# Patient Record
Sex: Female | Born: 1970 | Race: White | Hispanic: No | Marital: Single | Smoking: Current every day smoker
Health system: Southern US, Community
[De-identification: ages and names within clinical notes are randomized; demographics above are authoritative.]

## PROBLEM LIST (undated history)

## (undated) DIAGNOSIS — N189 Chronic kidney disease, unspecified: Secondary | ICD-10-CM

## (undated) DIAGNOSIS — C539 Malignant neoplasm of cervix uteri, unspecified: Secondary | ICD-10-CM

## (undated) DIAGNOSIS — K627 Radiation proctitis: Secondary | ICD-10-CM

## (undated) HISTORY — PX: NEPHROSTOMY TUBE PLACEMENT (ARMC HX): HXRAD1726

---

## 2012-01-13 DIAGNOSIS — K627 Radiation proctitis: Secondary | ICD-10-CM

## 2012-01-13 HISTORY — PX: INSERTION BRACHYTHERAPY DEVICE: SHX6582

## 2012-01-13 HISTORY — DX: Radiation proctitis: K62.7

## 2012-05-12 HISTORY — PX: PELVIC LYMPH NODE DISSECTION: SHX6543

## 2012-07-26 DIAGNOSIS — C539 Malignant neoplasm of cervix uteri, unspecified: Secondary | ICD-10-CM | POA: Diagnosis present

## 2015-04-13 HISTORY — PX: COLECTOMY WITH COLOSTOMY CREATION/HARTMANN PROCEDURE: SHX6598

## 2015-09-13 HISTORY — PX: COLOSTOMY REVISION: SHX5232

## 2016-01-13 HISTORY — PX: COLOSTOMY REVISION: SHX5232

## 2016-06-12 HISTORY — PX: ILEO LOOP DIVERSION: SHX1780

## 2016-06-12 HISTORY — PX: COLOSTOMY TAKEDOWN: SHX5783

## 2017-11-05 HISTORY — PX: ILEO LOOP COLOSTOMY CLOSURE: SHX5257

## 2018-01-19 ENCOUNTER — Encounter (HOSPITAL_COMMUNITY): Payer: Self-pay | Admitting: Emergency Medicine

## 2018-01-19 ENCOUNTER — Inpatient Hospital Stay (HOSPITAL_COMMUNITY): Payer: PPO

## 2018-01-19 ENCOUNTER — Emergency Department (HOSPITAL_COMMUNITY): Payer: PPO

## 2018-01-19 ENCOUNTER — Inpatient Hospital Stay (HOSPITAL_COMMUNITY)
Admission: EM | Admit: 2018-01-19 | Discharge: 2018-02-12 | DRG: 981 | Disposition: E | Payer: PPO | Attending: Pulmonary Disease | Admitting: Pulmonary Disease

## 2018-01-19 DIAGNOSIS — R195 Other fecal abnormalities: Secondary | ICD-10-CM | POA: Diagnosis not present

## 2018-01-19 DIAGNOSIS — I469 Cardiac arrest, cause unspecified: Secondary | ICD-10-CM | POA: Diagnosis present

## 2018-01-19 DIAGNOSIS — Z8541 Personal history of malignant neoplasm of cervix uteri: Secondary | ICD-10-CM

## 2018-01-19 DIAGNOSIS — Z936 Other artificial openings of urinary tract status: Secondary | ICD-10-CM

## 2018-01-19 DIAGNOSIS — N135 Crossing vessel and stricture of ureter without hydronephrosis: Secondary | ICD-10-CM

## 2018-01-19 DIAGNOSIS — K257 Chronic gastric ulcer without hemorrhage or perforation: Secondary | ICD-10-CM | POA: Diagnosis present

## 2018-01-19 DIAGNOSIS — Z4659 Encounter for fitting and adjustment of other gastrointestinal appliance and device: Secondary | ICD-10-CM

## 2018-01-19 DIAGNOSIS — K922 Gastrointestinal hemorrhage, unspecified: Secondary | ICD-10-CM

## 2018-01-19 DIAGNOSIS — G9340 Encephalopathy, unspecified: Secondary | ICD-10-CM | POA: Diagnosis present

## 2018-01-19 DIAGNOSIS — K627 Radiation proctitis: Secondary | ICD-10-CM | POA: Diagnosis present

## 2018-01-19 DIAGNOSIS — E876 Hypokalemia: Secondary | ICD-10-CM | POA: Diagnosis present

## 2018-01-19 DIAGNOSIS — R0902 Hypoxemia: Secondary | ICD-10-CM | POA: Diagnosis present

## 2018-01-19 DIAGNOSIS — K921 Melena: Secondary | ICD-10-CM | POA: Diagnosis not present

## 2018-01-19 DIAGNOSIS — G8929 Other chronic pain: Secondary | ICD-10-CM | POA: Diagnosis not present

## 2018-01-19 DIAGNOSIS — N136 Pyonephrosis: Secondary | ICD-10-CM | POA: Diagnosis not present

## 2018-01-19 DIAGNOSIS — J9601 Acute respiratory failure with hypoxia: Secondary | ICD-10-CM | POA: Diagnosis not present

## 2018-01-19 DIAGNOSIS — N184 Chronic kidney disease, stage 4 (severe): Secondary | ICD-10-CM | POA: Diagnosis present

## 2018-01-19 DIAGNOSIS — G92 Toxic encephalopathy: Principal | ICD-10-CM | POA: Diagnosis present

## 2018-01-19 DIAGNOSIS — Z515 Encounter for palliative care: Secondary | ICD-10-CM | POA: Diagnosis not present

## 2018-01-19 DIAGNOSIS — G893 Neoplasm related pain (acute) (chronic): Secondary | ICD-10-CM | POA: Diagnosis present

## 2018-01-19 DIAGNOSIS — K254 Chronic or unspecified gastric ulcer with hemorrhage: Secondary | ICD-10-CM | POA: Diagnosis not present

## 2018-01-19 DIAGNOSIS — K2900 Acute gastritis without bleeding: Secondary | ICD-10-CM | POA: Diagnosis present

## 2018-01-19 DIAGNOSIS — R571 Hypovolemic shock: Secondary | ICD-10-CM | POA: Diagnosis not present

## 2018-01-19 DIAGNOSIS — R4020 Unspecified coma: Secondary | ICD-10-CM | POA: Diagnosis not present

## 2018-01-19 DIAGNOSIS — I959 Hypotension, unspecified: Secondary | ICD-10-CM | POA: Diagnosis not present

## 2018-01-19 DIAGNOSIS — Z4682 Encounter for fitting and adjustment of non-vascular catheter: Secondary | ICD-10-CM | POA: Diagnosis not present

## 2018-01-19 DIAGNOSIS — R4182 Altered mental status, unspecified: Secondary | ICD-10-CM

## 2018-01-19 DIAGNOSIS — N1330 Unspecified hydronephrosis: Secondary | ICD-10-CM | POA: Diagnosis not present

## 2018-01-19 DIAGNOSIS — B370 Candidal stomatitis: Secondary | ICD-10-CM | POA: Diagnosis present

## 2018-01-19 DIAGNOSIS — E872 Acidosis: Secondary | ICD-10-CM | POA: Diagnosis not present

## 2018-01-19 DIAGNOSIS — I776 Arteritis, unspecified: Secondary | ICD-10-CM | POA: Diagnosis present

## 2018-01-19 DIAGNOSIS — K5900 Constipation, unspecified: Secondary | ICD-10-CM | POA: Diagnosis not present

## 2018-01-19 DIAGNOSIS — D696 Thrombocytopenia, unspecified: Secondary | ICD-10-CM | POA: Diagnosis present

## 2018-01-19 DIAGNOSIS — Z978 Presence of other specified devices: Secondary | ICD-10-CM

## 2018-01-19 DIAGNOSIS — I7772 Dissection of iliac artery: Secondary | ICD-10-CM | POA: Diagnosis not present

## 2018-01-19 DIAGNOSIS — I248 Other forms of acute ischemic heart disease: Secondary | ICD-10-CM | POA: Diagnosis not present

## 2018-01-19 DIAGNOSIS — Z436 Encounter for attention to other artificial openings of urinary tract: Secondary | ICD-10-CM | POA: Diagnosis not present

## 2018-01-19 DIAGNOSIS — R578 Other shock: Secondary | ICD-10-CM | POA: Diagnosis not present

## 2018-01-19 DIAGNOSIS — E860 Dehydration: Secondary | ICD-10-CM | POA: Diagnosis present

## 2018-01-19 DIAGNOSIS — D649 Anemia, unspecified: Secondary | ICD-10-CM

## 2018-01-19 DIAGNOSIS — F17200 Nicotine dependence, unspecified, uncomplicated: Secondary | ICD-10-CM | POA: Diagnosis present

## 2018-01-19 DIAGNOSIS — C539 Malignant neoplasm of cervix uteri, unspecified: Secondary | ICD-10-CM | POA: Diagnosis present

## 2018-01-19 DIAGNOSIS — D62 Acute posthemorrhagic anemia: Secondary | ICD-10-CM | POA: Diagnosis present

## 2018-01-19 DIAGNOSIS — R922 Inconclusive mammogram: Secondary | ICD-10-CM | POA: Diagnosis not present

## 2018-01-19 DIAGNOSIS — Z452 Encounter for adjustment and management of vascular access device: Secondary | ICD-10-CM

## 2018-01-19 DIAGNOSIS — I772 Rupture of artery: Secondary | ICD-10-CM | POA: Diagnosis not present

## 2018-01-19 DIAGNOSIS — N179 Acute kidney failure, unspecified: Secondary | ICD-10-CM | POA: Diagnosis present

## 2018-01-19 DIAGNOSIS — E878 Other disorders of electrolyte and fluid balance, not elsewhere classified: Secondary | ICD-10-CM | POA: Diagnosis present

## 2018-01-19 DIAGNOSIS — K91840 Postprocedural hemorrhage and hematoma of a digestive system organ or structure following a digestive system procedure: Secondary | ICD-10-CM | POA: Diagnosis not present

## 2018-01-19 DIAGNOSIS — T402X5A Adverse effect of other opioids, initial encounter: Secondary | ICD-10-CM | POA: Diagnosis present

## 2018-01-19 DIAGNOSIS — K633 Ulcer of intestine: Secondary | ICD-10-CM | POA: Diagnosis present

## 2018-01-19 DIAGNOSIS — K920 Hematemesis: Secondary | ICD-10-CM | POA: Diagnosis not present

## 2018-01-19 DIAGNOSIS — Z923 Personal history of irradiation: Secondary | ICD-10-CM

## 2018-01-19 DIAGNOSIS — M549 Dorsalgia, unspecified: Secondary | ICD-10-CM | POA: Diagnosis present

## 2018-01-19 DIAGNOSIS — J9811 Atelectasis: Secondary | ICD-10-CM | POA: Diagnosis not present

## 2018-01-19 DIAGNOSIS — Z91041 Radiographic dye allergy status: Secondary | ICD-10-CM

## 2018-01-19 DIAGNOSIS — D509 Iron deficiency anemia, unspecified: Secondary | ICD-10-CM | POA: Diagnosis not present

## 2018-01-19 DIAGNOSIS — T83098A Other mechanical complication of other indwelling urethral catheter, initial encounter: Secondary | ICD-10-CM

## 2018-01-19 DIAGNOSIS — Z9221 Personal history of antineoplastic chemotherapy: Secondary | ICD-10-CM

## 2018-01-19 HISTORY — DX: Malignant neoplasm of cervix uteri, unspecified: C53.9

## 2018-01-19 HISTORY — DX: Chronic kidney disease, unspecified: N18.9

## 2018-01-19 HISTORY — DX: Radiation proctitis: K62.7

## 2018-01-19 LAB — COMPREHENSIVE METABOLIC PANEL
ALT: 9 U/L (ref 0–44)
AST: 20 U/L (ref 15–41)
Albumin: 2.5 g/dL — ABNORMAL LOW (ref 3.5–5.0)
Alkaline Phosphatase: 67 U/L (ref 38–126)
Anion gap: 14 (ref 5–15)
BUN: 69 mg/dL — AB (ref 6–20)
CO2: 16 mmol/L — ABNORMAL LOW (ref 22–32)
Calcium: 9.2 mg/dL (ref 8.9–10.3)
Chloride: 104 mmol/L (ref 98–111)
Creatinine, Ser: 5.51 mg/dL — ABNORMAL HIGH (ref 0.44–1.00)
GFR calc Af Amer: 10 mL/min — ABNORMAL LOW (ref >60)
GFR calc non Af Amer: 9 mL/min — ABNORMAL LOW (ref >60)
Glucose, Bld: 98 mg/dL (ref 70–99)
Potassium: 4.5 mmol/L (ref 3.5–5.1)
Sodium: 134 mmol/L — ABNORMAL LOW (ref 135–145)
Total Bilirubin: 0.6 mg/dL (ref 0.3–1.2)
Total Protein: 7.5 g/dL (ref 6.5–8.1)

## 2018-01-19 LAB — CBC WITH DIFFERENTIAL/PLATELET
Abs Immature Granulocytes: 0.15 10*3/uL — ABNORMAL HIGH (ref 0.00–0.07)
Basophils Absolute: 0.1 10*3/uL (ref 0.0–0.1)
Basophils Relative: 1 %
Eosinophils Absolute: 0.2 10*3/uL (ref 0.0–0.5)
Eosinophils Relative: 2 %
HCT: 22.1 % — ABNORMAL LOW (ref 36.0–46.0)
Hemoglobin: 6.3 g/dL — CL (ref 12.0–15.0)
Immature Granulocytes: 2 %
LYMPHS PCT: 14 %
Lymphs Abs: 1.4 10*3/uL (ref 0.7–4.0)
MCH: 28.1 pg (ref 26.0–34.0)
MCHC: 28.5 g/dL — ABNORMAL LOW (ref 30.0–36.0)
MCV: 98.7 fL (ref 80.0–100.0)
MONO ABS: 0.9 10*3/uL (ref 0.1–1.0)
Monocytes Relative: 9 %
Neutro Abs: 7.2 10*3/uL (ref 1.7–7.7)
Neutrophils Relative %: 72 %
PLATELETS: 847 10*3/uL — AB (ref 150–400)
RBC: 2.24 MIL/uL — ABNORMAL LOW (ref 3.87–5.11)
RDW: 15.9 % — ABNORMAL HIGH (ref 11.5–15.5)
WBC: 9.9 10*3/uL (ref 4.0–10.5)
nRBC: 0 % (ref 0.0–0.2)

## 2018-01-19 LAB — I-STAT BETA HCG BLOOD, ED (MC, WL, AP ONLY): I-stat hCG, quantitative: 8.4 m[IU]/mL — ABNORMAL HIGH (ref <5)

## 2018-01-19 LAB — SALICYLATE LEVEL: Salicylate Lvl: <7 mg/dL (ref 2.8–30.0)

## 2018-01-19 LAB — AMMONIA: Ammonia: 15 umol/L (ref 9–35)

## 2018-01-19 LAB — ABO/RH: ABO/RH(D): A POS

## 2018-01-19 LAB — PREPARE RBC (CROSSMATCH)

## 2018-01-19 LAB — I-STAT CG4 LACTIC ACID, ED
Lactic Acid, Venous: 0.45 mmol/L — ABNORMAL LOW (ref 0.5–1.9)
Lactic Acid, Venous: 0.75 mmol/L (ref 0.5–1.9)

## 2018-01-19 LAB — ETHANOL: Alcohol, Ethyl (B): <10 mg/dL (ref <10)

## 2018-01-19 LAB — ACETAMINOPHEN LEVEL: Acetaminophen (Tylenol), Serum: <10 ug/mL — ABNORMAL LOW (ref 10–30)

## 2018-01-19 MED ORDER — SODIUM CHLORIDE 0.9 % IV SOLN
10.0000 mL/h | Freq: Once | INTRAVENOUS | Status: AC
  Administered 2018-01-19: 10 mL/h via INTRAVENOUS

## 2018-01-19 MED ORDER — SODIUM CHLORIDE 0.9 % IV BOLUS
1000.0000 mL | Freq: Once | INTRAVENOUS | Status: AC
  Administered 2018-01-20: 1000 mL via INTRAVENOUS

## 2018-01-19 MED ORDER — SODIUM CHLORIDE 0.9 % IV BOLUS (SEPSIS)
1000.0000 mL | Freq: Once | INTRAVENOUS | Status: AC
  Administered 2018-01-19: 1000 mL via INTRAVENOUS

## 2018-01-19 NOTE — ED Notes (Signed)
Pt found to have ripped out 2nd IV. RN to call IV team to look for a second IV.

## 2018-01-19 NOTE — ED Notes (Signed)
IV team at bedside 

## 2018-01-19 NOTE — ED Triage Notes (Signed)
Pt presents to ED for AMS. Pt has hx of cervical cancer, bilateral nephrostomy tubes placed for about 18 months. Pt very pale, lethargic, hypotensive 93/64 in triage.

## 2018-01-19 NOTE — ED Notes (Signed)
Patient transported to X-ray 

## 2018-01-19 NOTE — ED Notes (Signed)
Blood consent signed by sister and witnessed by this RN.

## 2018-01-19 NOTE — ED Provider Notes (Signed)
[facility] Provider Note   CSN: [id number] Arrival date & time: 02/10/2018  1843     History   Chief Complaint Chief Complaint  Patient presents with  . Altered Mental Status    HPI Katherine Kramer is a 48 y.o. female.  HPI Family and friends brought her to the ED because pt had not been answering the phone the last two days.  When they found her, her urostomy tube was unconnected.  The other tube looked dirty.  Pt appeared very disheveled and confused.   Patient has a complicated medical history.  She has a history of cervical cancer with complications.  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  Patient previously had a colostomy but that was reversed .  pt has chronic back pain issues with her cancer.  They care concerned that she had take too many pills.  Pt had 120 pills of oxycodone pills dec 18 and she only 5 pills left.  She also had a fentanyl patch on.   Pt denies fever.  She has been having diarrhea.   Past Medical History:  Diagnosis Date  . Cervical cancer ([place])   . CKD (chronic kidney disease)     There are no active problems to display for this patient.   History reviewed. No pertinent surgical history.     Home Medications    Prior to Admission medications   Not on File    Family History No family history on file.  Social History Social History   Tobacco Use  . Smoking status: Not on file  Substance Use Topics  . Alcohol use: Not on file  . Drug use: Not on file     Allergies   Patient has no allergy information on record.   Review of Systems Review of Systems  All other systems reviewed and are negative.    Physical Exam Updated Vital Signs BP (!) 88/61   Pulse 79   Temp (!) 97.5 F (36.4 C) (Oral)   Resp 12   SpO2 100%   Physical Exam Vitals signs and nursing note reviewed.  Constitutional:      General: She is not in acute distress.    Appearance: She  is well-developed. She is ill-appearing.     Comments: Underweight  HENT:     Head: Normocephalic and atraumatic.     Right Ear: External ear normal.     Left Ear: External ear normal.  Eyes:     General: No scleral icterus.       Right eye: No discharge.        Left eye: No discharge.     Conjunctiva/sclera: Conjunctivae normal.  Neck:     Musculoskeletal: Neck supple.     Trachea: No tracheal deviation.  Cardiovascular:     Rate and Rhythm: Normal rate and regular rhythm.  Pulmonary:     Effort: Pulmonary effort is normal. No respiratory distress.     Breath sounds: Normal breath sounds. No stridor. No wheezing or rales.  Abdominal:     General: Bowel sounds are normal. There is no distension.     Palpations: Abdomen is soft.     Tenderness: There is no abdominal tenderness. There is no guarding or rebound.     Comments: Nephrostomy tubes in the flank area, no purulent drainage no erythema  Musculoskeletal:        General: No tenderness.  Skin:    General: Skin is warm  and dry.     Findings: No rash.  Neurological:     Mental Status: She is lethargic.     GCS: GCS eye subscore is 4. GCS verbal subscore is 4. GCS motor subscore is 6.     Cranial Nerves: No cranial nerve deficit (no facial droop, extraocular movements intact, no slurred speech).     Sensory: No sensory deficit.     Motor: No abnormal muscle tone or seizure activity.     Coordination: Coordination normal.     Comments: No focal neurologic deficits, patient is confused and per the family answering questions inappropriately      ED Treatments / Results  Labs (all labs ordered are listed, but only abnormal results are displayed) Labs Reviewed  COMPREHENSIVE METABOLIC PANEL - Abnormal; Notable for the following components:      Result Value   Sodium 134 (*)    CO2 16 (*)    BUN 69 (*)    Creatinine, Ser 5.51 (*)    Albumin 2.5 (*)    GFR calc non Af Amer 9 (*)    GFR calc Af Amer 10 (*)    All other  components within normal limits  CBC WITH DIFFERENTIAL/PLATELET - Abnormal; Notable for the following components:   RBC 2.24 (*)    Hemoglobin 6.3 (*)    HCT 22.1 (*)    MCHC 28.5 (*)    RDW 15.9 (*)    Platelets 847 (*)    Abs Immature Granulocytes 0.15 (*)    All other components within normal limits  ACETAMINOPHEN LEVEL - Abnormal; Notable for the following components:   Acetaminophen (Tylenol), Serum <10 (*)    All other components within normal limits  I-STAT CG4 LACTIC ACID, ED - Abnormal; Notable for the following components:   Lactic Acid, Venous 0.45 (*)    All other components within normal limits  I-STAT BETA HCG BLOOD, ED ([facility], [facility], [facility] ONLY) - Abnormal; Notable for the following components:   I-stat hCG, quantitative 8.4 (*)    All other components within normal limits  CULTURE, BLOOD (ROUTINE X 2)  CULTURE, BLOOD (ROUTINE X 2)  ETHANOL  SALICYLATE LEVEL  AMMONIA  URINALYSIS, ROUTINE W REFLEX MICROSCOPIC  RAPID URINE DRUG SCREEN, HOSP PERFORMED  I-STAT CG4 LACTIC ACID, ED  POC OCCULT BLOOD, ED  PREPARE RBC (CROSSMATCH)  TYPE AND SCREEN    EKG None  Radiology Ct Head Wo Contrast  Result Date: 02/09/2018 CLINICAL DATA:  Altered level of consciousness (LOC), unexplained EXAM: CT HEAD WITHOUT CONTRAST TECHNIQUE: Contiguous axial images were obtained from the base of the skull through the vertex without intravenous contrast. COMPARISON:  None. FINDINGS: Brain: No intracranial hemorrhage, mass effect, or midline shift. No hydrocephalus. The basilar cisterns are patent. No evidence of territorial infarct or acute ischemia. No extra-axial or intracranial fluid collection. Vascular: No hyperdense vessel. Skull: No fracture or focal lesion. Sinuses/Orbits: Paranasal sinuses and mastoid air cells are clear. The visualized orbits are unremarkable. Other: None. IMPRESSION: Negative head CT. Electronically Signed   By: Keith Rake M.D.   On: 01/13/2018 20:41   Dg Abdomen Acute  W/chest  Result Date: 02/09/2018 CLINICAL DATA:  Hypotension with nephrostomy tubes and history of ovarian cancer. EXAM: DG ABDOMEN ACUTE W/ 1V CHEST COMPARISON:  None. FINDINGS: There is no evidence of dilated bowel loops or free intraperitoneal air. Moderate stool retention within the colon. Bilateral pigtail ureteral calculi are noted as well as bilateral external nephrostomy tubes without complicating  features. No radiopaque calculi or other significant radiographic abnormality is seen. Heart size and mediastinal contours are within normal limits. Both lungs are clear. IMPRESSION: Moderate stool retention within the colon without bowel obstruction. Bilateral ureteral and nephrostomy tubes are in place without complicating features. No active pulmonary disease. Electronically Signed   By: Ashley Royalty M.D.   On: 01/25/2018 21:55    Procedures .Critical Care Performed by: Dorie Rank, MD Authorized by: Dorie Rank, MD   Critical care provider statement:    Critical care time (minutes):  45   Critical care was time spent personally by me on the following activities:  Discussions with consultants, evaluation of patient's response to treatment, examination of patient, ordering and performing treatments and interventions, ordering and review of laboratory studies, ordering and review of radiographic studies, pulse oximetry, re-evaluation of patient's condition, obtaining history from patient or surrogate and review of old charts   (including critical care time)  Medications Ordered in ED Medications  0.9 %  sodium chloride infusion (has no administration in time range)  sodium chloride 0.9 % bolus 1,000 mL (has no administration in time range)  sodium chloride 0.9 % bolus 1,000 mL (1,000 mLs Intravenous New Bag/Given 02/11/2018 2006)    And  sodium chloride 0.9 % bolus 1,000 mL (1,000 mLs Intravenous New Bag/Given 02/08/2018 2050)     Initial Impression / Assessment and Plan / ED Course  I have reviewed  the triage vital signs and the nursing notes.  Pertinent labs & imaging results that were available during my care of the patient were reviewed by me and considered in my medical decision making (see chart for details).  Clinical Course as of Jan 20 2219  Wed Jan 19, 2018  2045 Hemoglobin was 8.1 on November 13 per care everywhere records   [JK]  2045 Anemia noted.  With her hypotension I will order blood transfusion   [JK]  2128 CR 3.19 in Nov   [JK]    Clinical Course User Index [JK] Dorie Rank, MD    Patient presented to the emergency room for evaluation of altered mental status.  Family members were concerned that she was taking too many of her medications.  Patient also has multiple medical problems.  Her ED work-up is notable for anemia that is worse compared to her baseline.  She also has evidence of acute chronic kidney injury with increasing creatinine compared to her baseline.  Patient has been hypotensive but she does not have an elevated lactic acid level and I suspect her hypotension is related to her dehydration.  Unclear if the patient's had any blood in her stool so we will Hemoccult her her stool.  I have ordered a blood transfusion for her anemia.  I will consult the medical service for admission and further treatment.  Final Clinical Impressions(s) / ED Diagnoses   Final diagnoses:  AKI (acute kidney injury) ([place])  Anemia, unspecified type  Altered mental status, unspecified altered mental status type     Dorie Rank, MD 02/08/2018 2226

## 2018-01-19 NOTE — ED Notes (Signed)
ED Provider at bedside. 

## 2018-01-20 ENCOUNTER — Encounter (HOSPITAL_COMMUNITY): Payer: Self-pay | Admitting: Internal Medicine

## 2018-01-20 ENCOUNTER — Inpatient Hospital Stay (HOSPITAL_COMMUNITY): Payer: PPO

## 2018-01-20 DIAGNOSIS — D649 Anemia, unspecified: Secondary | ICD-10-CM

## 2018-01-20 DIAGNOSIS — N184 Chronic kidney disease, stage 4 (severe): Secondary | ICD-10-CM | POA: Diagnosis present

## 2018-01-20 DIAGNOSIS — G9340 Encephalopathy, unspecified: Secondary | ICD-10-CM | POA: Diagnosis present

## 2018-01-20 LAB — URINALYSIS, ROUTINE W REFLEX MICROSCOPIC
Bilirubin Urine: NEGATIVE
Glucose, UA: NEGATIVE mg/dL
Ketones, ur: NEGATIVE mg/dL
Nitrite: NEGATIVE
Protein, ur: 100 mg/dL — AB
RBC / HPF: >50 RBC/hpf — ABNORMAL HIGH (ref 0–5)
Specific Gravity, Urine: 1.008 (ref 1.005–1.030)
pH: 8 (ref 5.0–8.0)

## 2018-01-20 LAB — CBC WITH DIFFERENTIAL/PLATELET
Abs Immature Granulocytes: 0.16 10*3/uL — ABNORMAL HIGH (ref 0.00–0.07)
Abs Immature Granulocytes: 0.33 10*3/uL — ABNORMAL HIGH (ref 0.00–0.07)
Basophils Absolute: 0 10*3/uL (ref 0.0–0.1)
Basophils Absolute: 0.1 10*3/uL (ref 0.0–0.1)
Basophils Relative: 1 %
Basophils Relative: 1 %
EOS PCT: 2 %
Eosinophils Absolute: 0.2 10*3/uL (ref 0.0–0.5)
Eosinophils Absolute: 0.2 10*3/uL (ref 0.0–0.5)
Eosinophils Relative: 3 %
HCT: 22.1 % — ABNORMAL LOW (ref 36.0–46.0)
HCT: 33 % — ABNORMAL LOW (ref 36.0–46.0)
HEMOGLOBIN: 9.9 g/dL — AB (ref 12.0–15.0)
Hemoglobin: 6.3 g/dL — CL (ref 12.0–15.0)
Immature Granulocytes: 2 %
Immature Granulocytes: 4 %
LYMPHS PCT: 13 %
Lymphocytes Relative: 12 %
Lymphs Abs: 1 10*3/uL (ref 0.7–4.0)
Lymphs Abs: 1.1 10*3/uL (ref 0.7–4.0)
MCH: 27.9 pg (ref 26.0–34.0)
MCH: 28.8 pg (ref 26.0–34.0)
MCHC: 28.5 g/dL — ABNORMAL LOW (ref 30.0–36.0)
MCHC: 30 g/dL (ref 30.0–36.0)
MCV: 97.8 fL (ref 80.0–100.0)
MCV: 95.9 fL (ref 80.0–100.0)
MONO ABS: 0.7 10*3/uL (ref 0.1–1.0)
Monocytes Absolute: 0.8 10*3/uL (ref 0.1–1.0)
Monocytes Relative: 10 %
Monocytes Relative: 8 %
NEUTROS ABS: 5.6 10*3/uL (ref 1.7–7.7)
Neutro Abs: 6.7 10*3/uL (ref 1.7–7.7)
Neutrophils Relative %: 71 %
Neutrophils Relative %: 73 %
Platelets: 623 10*3/uL — ABNORMAL HIGH (ref 150–400)
Platelets: 774 10*3/uL — ABNORMAL HIGH (ref 150–400)
RBC: 2.26 MIL/uL — AB (ref 3.87–5.11)
RBC: 3.44 MIL/uL — ABNORMAL LOW (ref 3.87–5.11)
RDW: 15.5 % (ref 11.5–15.5)
RDW: 15.3 % (ref 11.5–15.5)
WBC: 7.9 10*3/uL (ref 4.0–10.5)
WBC: 9 10*3/uL (ref 4.0–10.5)
nRBC: 0 % (ref 0.0–0.2)
nRBC: 0 % (ref 0.0–0.2)

## 2018-01-20 LAB — COMPREHENSIVE METABOLIC PANEL
ALT: 6 U/L (ref 0–44)
AST: 16 U/L (ref 15–41)
Albumin: 1.9 g/dL — ABNORMAL LOW (ref 3.5–5.0)
Alkaline Phosphatase: 51 U/L (ref 38–126)
Anion gap: 11 (ref 5–15)
BILIRUBIN TOTAL: 0.9 mg/dL (ref 0.3–1.2)
BUN: 62 mg/dL — ABNORMAL HIGH (ref 6–20)
CO2: 14 mmol/L — ABNORMAL LOW (ref 22–32)
Calcium: 7.9 mg/dL — ABNORMAL LOW (ref 8.9–10.3)
Chloride: 113 mmol/L — ABNORMAL HIGH (ref 98–111)
Creatinine, Ser: 4.78 mg/dL — ABNORMAL HIGH (ref 0.44–1.00)
GFR calc Af Amer: 12 mL/min — ABNORMAL LOW (ref >60)
GFR calc non Af Amer: 10 mL/min — ABNORMAL LOW (ref >60)
Glucose, Bld: 89 mg/dL (ref 70–99)
Potassium: 4 mmol/L (ref 3.5–5.1)
Sodium: 138 mmol/L (ref 135–145)
TOTAL PROTEIN: 5.8 g/dL — AB (ref 6.5–8.1)

## 2018-01-20 LAB — RAPID URINE DRUG SCREEN, HOSP PERFORMED
Amphetamines: NOT DETECTED
BARBITURATES: NOT DETECTED
Benzodiazepines: POSITIVE — AB
Cocaine: NOT DETECTED
Opiates: POSITIVE — AB
Tetrahydrocannabinol: NOT DETECTED

## 2018-01-20 LAB — PROCALCITONIN: Procalcitonin: 1.25 ng/mL

## 2018-01-20 LAB — I-STAT ARTERIAL BLOOD GAS, ED
ACID-BASE DEFICIT: 11 mmol/L — AB (ref 0.0–2.0)
Bicarbonate: 14.7 mmol/L — ABNORMAL LOW (ref 20.0–28.0)
O2 Saturation: 95 %
Patient temperature: 97.8
TCO2: 16 mmol/L — ABNORMAL LOW (ref 22–32)
pCO2 arterial: 31.6 mmHg — ABNORMAL LOW (ref 32.0–48.0)
pH, Arterial: 7.272 — ABNORMAL LOW (ref 7.350–7.450)
pO2, Arterial: 85 mmHg (ref 83.0–108.0)

## 2018-01-20 LAB — CK: Total CK: 118 U/L (ref 38–234)

## 2018-01-20 LAB — HCG, SERUM, QUALITATIVE: Preg, Serum: NEGATIVE

## 2018-01-20 LAB — GLUCOSE, CAPILLARY
Glucose-Capillary: 60 mg/dL — ABNORMAL LOW (ref 70–99)
Glucose-Capillary: 66 mg/dL — ABNORMAL LOW (ref 70–99)
Glucose-Capillary: 72 mg/dL (ref 70–99)

## 2018-01-20 LAB — TROPONIN I: Troponin I: <0.03 ng/mL (ref <0.03)

## 2018-01-20 LAB — HIV ANTIBODY (ROUTINE TESTING W REFLEX): HIV Screen 4th Generation wRfx: NONREACTIVE

## 2018-01-20 LAB — POC OCCULT BLOOD, ED: Fecal Occult Bld: POSITIVE — AB

## 2018-01-20 LAB — LACTIC ACID, PLASMA: Lactic Acid, Venous: 0.4 mmol/L — ABNORMAL LOW (ref 0.5–1.9)

## 2018-01-20 LAB — CBG MONITORING, ED: Glucose-Capillary: 79 mg/dL (ref 70–99)

## 2018-01-20 MED ORDER — ACETAMINOPHEN 325 MG PO TABS: 650.0000 mg | ORAL_TABLET | Freq: Four times a day (QID) | ORAL | Status: DC | PRN

## 2018-01-20 MED ORDER — ONDANSETRON HCL 4 MG PO TABS: 4.0000 mg | ORAL_TABLET | Freq: Four times a day (QID) | ORAL | Status: DC | PRN

## 2018-01-20 MED ORDER — CIPROFLOXACIN IN D5W 200 MG/100ML IV SOLN
200.0000 mg | INTRAVENOUS | Status: DC
  Administered 2018-01-20: 200 mg via INTRAVENOUS
  Filled 2018-01-20: qty 100

## 2018-01-20 MED ORDER — BISACODYL 10 MG RE SUPP: 10.0000 mg | Freq: Once | RECTAL | Status: DC

## 2018-01-20 MED ORDER — SODIUM CHLORIDE 0.9 % IV SOLN
INTRAVENOUS | Status: DC
  Administered 2018-01-20 – 2018-01-21 (×3): via INTRAVENOUS

## 2018-01-20 MED ORDER — SODIUM CHLORIDE 0.9 % IV SOLN
INTRAVENOUS | Status: DC
  Administered 2018-01-20: 03:00:00 via INTRAVENOUS

## 2018-01-20 MED ORDER — ACETAMINOPHEN 650 MG RE SUPP: 650.0000 mg | Freq: Four times a day (QID) | RECTAL | Status: DC | PRN

## 2018-01-20 MED ORDER — ONDANSETRON HCL 4 MG/2ML IJ SOLN
4.0000 mg | Freq: Four times a day (QID) | INTRAMUSCULAR | Status: DC | PRN
  Administered 2018-01-20 – 2018-01-23 (×2): 4 mg via INTRAVENOUS
  Filled 2018-01-20 (×2): qty 2

## 2018-01-20 NOTE — H&P (Signed)
History and Physical    Katherine Kramer BJS:[id number] DOB: 04-Apr-1970 DOA: 02/03/2018  PCP: Patient, No Pcp Per  Patient coming from: Home.  History of new from patient's sister.  Patient is adopted.  Chief Complaint: Altered mental status.  HPI: Katherine Kramer is a 48 y.o. 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  As per the patient's sister patient was unable to be reached for last 2 days and went to check on the patient and patient was lying on the bed confused and lethargic.  Patient does not drink alcohol has had previous history of tobacco abuse which patient has quit.  As per the patient's sister patient primary care physician is planning to decrease her oxycodone intake and has been prescribed 120 pills of oxycodone on December 29, 2017 and only 5 pills are remaining when they checked the pillbox today.  It was suspected that patient may have taken more than her required dose of oxycodone.   ED Course: In the ER patient remained hypotensive but lactate was normal patient was afebrile and labs show worsening renal function with creatinine around 5 baseline is around 3.1 hemoglobin has decreased to 6 baseline is around 8.  Blood pressure improved with 3 days of fluids.  Blood cultures were sent.  UA was still pending at the time of my exam.  Nephrostomy tube is making minimal output.  Ultrasound of the abdomen was done which shows bilateral hydronephrosis recent CT abdomen done on yesterday also showed similar picture.  I did discuss with on-call urologist.  Patient at the time of my exam is answering few questions but at times becomes confused.  Moving all extremities.  CT head  was unremarkable.  Urine drug screen is positive for opiates and benzos which patient does take.  Pupils not pinpoint.  Patient admitted for acute encephalopathy likely from medication overdose in the setting of worsening renal function.  Acute abdominal series shows moderate stool with the nephrostomy tube in place.  Chest was unremarkable.  Review of Systems: As per HPI, rest all negative.   Past Medical History:  Diagnosis Date  . Cervical cancer ([place])   . CKD (chronic kidney disease)     Past Surgical History:  Procedure Laterality Date  . bilatteral salpingo oopherectomy    . COLON SURGERY    . NEPHROSTOMY       reports that she has been smoking. She has never used smokeless tobacco. No history on file for alcohol and drug.  Allergies  Allergen Reactions  . Iodine Other (See Comments)    headache  . Iodinated Diagnostic Agents Other (See Comments)    Pt states the allergy was to IV dye not oral contrast  . Piperacillin-Tazobactam In Dex Other (See Comments)    headache    Family History  Family history unknown: Yes    Prior to Admission medications   Medication Sig Start Date End Date Taking? Authorizing Provider  acetaminophen (TYLENOL) 500 MG tablet Take 1,000 mg by mouth every 8 (eight) hours as needed for pain. 11/10/17  Yes [provider]  ALPRAZolam Duanne Moron) 1 MG tablet Take 1 mg by mouth at bedtime as needed for anxiety.   Yes [provider]  alprazolam Duanne Moron) 2 MG tablet Take  2 mg by mouth 2 (two) times daily as needed for anxiety. 03/24/17  Yes [provider]  fentaNYL (DURAGESIC - DOSED MCG/HR) 75 MCG/HR Place 1 patch onto the skin every 3 (three) days. 03/22/17  Yes [provider]  gabapentin (NEURONTIN) 300 MG capsule Take 300 mg by mouth 3 (three) times daily. 11/10/17  Yes [provider]  ondansetron (ZOFRAN) 8 MG tablet Take 8 mg by mouth as needed for nausea/vomiting.   Yes [provider]    oxyCODONE (ROXICODONE) 15 MG immediate release tablet Take 15 mg by mouth every 4 (four) hours as needed for pain.   Yes [provider]  phenazopyridine (PYRIDIUM) 95 MG tablet Take 95 mg by mouth 3 (three) times daily as needed for pain.   Yes [provider]  senna (SENNA-TIME) 8.6 MG tablet Take 1 tablet by mouth 2 (two) times daily.   Yes [provider]  sodium bicarbonate 650 MG tablet Take 650 mg by mouth 2 (two) times daily. 03/25/17 03/25/18 Yes [provider]  Vitamin D, Ergocalciferol, (DRISDOL) 1.25 MG (50000 UT) CAPS capsule Take 50,000 Units by mouth once a week. 03/25/17 03/25/18 Yes [provider]    Physical Exam: Vitals:   02/05/2018 2300 02/06/2018 2330 01/25/2018 2333 01/18/2018 2334  BP: (!) 85/55 99/69 105/76 105/76  Pulse: 80 80 (!) 120 69  Resp: (!) 23 16 18 17   Temp:    (!) 97.4 F (36.3 C)  TempSrc:    Oral  SpO2: 100% 100% 100% 100%      Constitutional: Moderately built and nourished. Vitals:   01/13/2018 2300 01/21/2018 2330 01/21/2018 2333 02/03/2018 2334  BP: (!) 85/55 99/69 105/76 105/76  Pulse: 80 80 (!) 120 69  Resp: (!) 23 16 18 17   Temp:    (!) 97.4 F (36.3 C)  TempSrc:    Oral  SpO2: 100% 100% 100% 100%   Eyes: Anicteric no pallor. ENMT: No discharge from the ears eyes nose or mouth. Neck: No mass felt.  No neck rigidity. Respiratory: No rhonchi or crepitations. Cardiovascular: S1-S2 heard. Abdomen: Soft nontender bowel sounds present. Musculoskeletal: No edema.  No joint effusion. Skin: No rash. Neurologic: Patient is alert awake oriented to her name and place appears confused moves all extremities. Psychiatric: Appears confused oriented to name and place.   Labs on Admission: I have personally reviewed following labs and imaging studies  CBC: Recent Labs  Lab 01/14/2018 2000  WBC 9.9  NEUTROABS 7.2  HGB 6.3*  HCT 22.1*  MCV 98.7  PLT 518*   Basic Metabolic Panel: Recent Labs  Lab  02/07/2018 2000  NA 134*  K 4.5  CL 104  CO2 16*  GLUCOSE 98  BUN 69*  CREATININE 5.51*  CALCIUM 9.2   GFR: CrCl cannot be calculated (Unknown ideal weight.). Liver Function Tests: Recent Labs  Lab 02/09/2018 2000  AST 20  ALT 9  ALKPHOS 67  BILITOT 0.6  PROT 7.5  ALBUMIN 2.5*   No results for input(s): LIPASE, AMYLASE in the last 168 hours. Recent Labs  Lab 01/26/2018 2000  AMMONIA 15   Coagulation Profile: No results for input(s): INR, PROTIME in the last 168 hours. Cardiac Enzymes: No results for input(s): CKTOTAL, CKMB, CKMBINDEX, TROPONINI in the last 168 hours. BNP (last 3 results) No results for input(s): PROBNP in the last 8760 hours. HbA1C: No results for input(s): HGBA1C in the last 72 hours. CBG: No results for input(s): GLUCAP in the last 168  hours. Lipid Profile: No results for input(s): CHOL, HDL, LDLCALC, TRIG, CHOLHDL, LDLDIRECT in the last 72 hours. Thyroid Function Tests: No results for input(s): TSH, T4TOTAL, FREET4, T3FREE, THYROIDAB in the last 72 hours. Anemia Panel: No results for input(s): VITAMINB12, FOLATE, FERRITIN, TIBC, IRON, RETICCTPCT in the last 72 hours. Urine analysis: No results found for: COLORURINE, APPEARANCEUR, LABSPEC, PHURINE, GLUCOSEU, HGBUR, BILIRUBINUR, KETONESUR, PROTEINUR, UROBILINOGEN, NITRITE, LEUKOCYTESUR Sepsis Labs: @LABRCNTIP (procalcitonin:4,lacticidven:4) )No results found for this or any previous visit (from the past 240 hour(s)).   Radiological Exams on Admission: Ct Head Wo Contrast  Result Date: 01/12/2018 CLINICAL DATA:  Altered level of consciousness (LOC), unexplained EXAM: CT HEAD WITHOUT CONTRAST TECHNIQUE: Contiguous axial images were obtained from the base of the skull through the vertex without intravenous contrast. COMPARISON:  None. FINDINGS: Brain: No intracranial hemorrhage, mass effect, or midline shift. No hydrocephalus. The basilar cisterns are patent. No evidence of territorial infarct or acute  ischemia. No extra-axial or intracranial fluid collection. Vascular: No hyperdense vessel. Skull: No fracture or focal lesion. Sinuses/Orbits: Paranasal sinuses and mastoid air cells are clear. The visualized orbits are unremarkable. Other: None. IMPRESSION: Negative head CT. Electronically Signed   By: Keith Rake M.D.   On: 01/30/2018 20:41   Dg Abdomen Acute W/chest  Result Date: 02/04/2018 CLINICAL DATA:  Hypotension with nephrostomy tubes and history of ovarian cancer. EXAM: DG ABDOMEN ACUTE W/ 1V CHEST COMPARISON:  None. FINDINGS: There is no evidence of dilated bowel loops or free intraperitoneal air. Moderate stool retention within the colon. Bilateral pigtail ureteral calculi are noted as well as bilateral external nephrostomy tubes without complicating features. No radiopaque calculi or other significant radiographic abnormality is seen. Heart size and mediastinal contours are within normal limits. Both lungs are clear. IMPRESSION: Moderate stool retention within the colon without bowel obstruction. Bilateral ureteral and nephrostomy tubes are in place without complicating features. No active pulmonary disease. Electronically Signed   By: Ashley Royalty M.D.   On: 02/07/2018 21:55     Assessment/Plan Principal Problem:   ARF (acute renal failure) (HCC) Active Problems:   Anemia   Cervical cancer (HCC)   CKD (chronic kidney disease) stage 4, GFR 15-29 ml/min (HCC)   Acute encephalopathy    1. Acute encephalopathy likely from medication likely oxycodone and also takes gabapentin in the setting of worsening renal function -I am keeping patient n.p.o. and holding all pain medications including her Duragesic patch.  Closely monitor for any withdrawal.  Patient will be admitted for stepdown.  No indication for any Narcan drip at this time.  Patient is protecting her airway and in fact following commands and talking.  Will check ABG and ammonia. 2. Hypotension likely from dehydration and also  could be from narcotic effects.  Improved with fluids.  Patient is afebrile.  Blood cultures have been sent.  UA is pending.  If UA does show any signs of infection we will keep patient on antibiotics.  Check procalcitonin.  2 units of PRBC transfusion has been ordered. 3. Acute renal failure on chronic kidney disease stage IV with history of bilateral nephrostomy placement with sonogram showing hydronephrosis -I did discuss with on-call urologist who advised to get a IR consult to interrogate the nephrostomy tube and likely may need replacement since patient last replacement as per the chart was in September.  Closely follow intake output metabolic panel.  Renal failure could be from hypotension dehydration and worsening anemia.  If urine output does not improve with nephrostomy tube replacement  and may need nephrology consult. 4. Worsening anemia with known history of chronic anemia.  2 units of PRBC transfusion has been ordered.  Follow CBC. 5. Chronic pain presently on oral pain medication including gabapentin Duragesic patch oxycodone and also Xanax are on hold. 6. History of cervical cancer status post chemoradiation complicated by proctitis and has had recent takedown of Hartman's procedure.   DVT prophylaxis: SCDs. Code Status: Full code. Family Communication: Patient's sister. Disposition Plan: To be determined. Consults called: Discussed with urologist.  Interventional radiology for nephrostomy tube interrogation. Admission status: Inpatient.   Rise Patience MD [facility] Pager [phone].  If 7PM-7AM, please contact night-coverage [email or web address] Password [email or web address]  01/20/2018, 12:41 AM

## 2018-01-20 NOTE — ED Notes (Signed)
Pt taken to US

## 2018-01-20 NOTE — ED Notes (Signed)
Admitting MD at bedside.

## 2018-01-20 NOTE — ED Notes (Signed)
Admitting at bedside 

## 2018-01-20 NOTE — Progress Notes (Signed)
IR aware of request for PCN check. Pt with met cervical cancer and complex surgical and chemoradiation hx. Most care at [facility]. She has (B)PCNs, last exchanged in Sept 2019. She's been admitted with AMS, felt secondary to opiate use. [place] was performed which showed PCNs intact, however presence of bilateral hydronephrosis.  Given last exchange was almost 4 months ago, recommend bilateral PCN exchange. Attempted to contact family for consent, but was unsuccessful. Pt not felt to be able to consent for herself at this time. D/w attending physician, exchange not felt to be urgent. Will attempt tomorrow if pt mental status improves or we can obtain consent from family.   Ascencion Dike PA-C Interventional Radiology 01/20/2018 4:15 PM

## 2018-01-20 NOTE — Progress Notes (Signed)
Pharmacy Antibiotic Note  Katherine Kramer is a 48 y.o. female admitted on 01/27/2018 with UTI.  Pharmacy has been consulted for cipro dosing. Pt is afebrile and WBC is WNL. Pt with poor renal function.   Plan: Cipro 200mg  IV Q24H F/u LOT  *Pharmacy will sign off. Thank you for the consult!  Height: 5\' 2"  (157.5 cm) Weight: 114 lb 10.2 oz (52 kg) IBW/kg (Calculated) : 50.1  Temp (24hrs), Avg:97.6 F (36.4 C), Min:97.4 F (36.3 C), Max:97.8 F (36.6 C)  Recent Labs  Lab 01/20/2018 2000 01/18/2018 2011 01/17/2018 2204 01/20/18 0329  WBC 9.9  --   --  7.9  CREATININE 5.51*  --   --  4.78*  LATICACIDVEN  --  0.45* 0.75 0.4*    Estimated Creatinine Clearance: 11.8 mL/min (A) (by C-G formula based on SCr of 4.78 mg/dL (H)).    Allergies  Allergen Reactions  . Iodine Other (See Comments)    headache  . Iodinated Diagnostic Agents Other (See Comments)    Pt states the allergy was to IV dye not oral contrast  . Piperacillin-Tazobactam In Dex Other (See Comments)    headache    Thank you for allowing pharmacy to be a part of this patient's care.  Katherine Kramer, Katherine Kramer 01/20/2018 8:37 AM

## 2018-01-20 NOTE — Progress Notes (Signed)
PROGRESS NOTE   Katherine Kramer  ZHY:[id number]    DOB: 01-Aug-1970    DOA: 01/26/2018  PCP: Patient, No Pcp Per   I have briefly reviewed patients previous medical records in [facility].  Brief Narrative:  As noted in care everywhere from most recent office visit at [facility]: "  48 y.o. 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) status post pelvic lymphadenectomy and oophoropexy (05/2012), chemoradiation (EBRT and HDR brachytherapy w/ weekly cisplatin, completed 02/9526); complicated by radiation proctitis, bilateral ureteral obstruction status post bilateral nephrostomy tubes (last changed 04/29/2017) and sigmoid perforation status post Hartmann's procedure (04/2015), colostomy dilatation for ostomy retraction (09/2015), colostomy revision (01/2016) and Hartmann's closure with diverting loop ileostomy (06/2016)" now presented to [facility] ED on 02/04/2018 after family and friends could not reach her over the phone for 2 days, when they found her, her urostomy tube was unconnected, the other tube looked dirty, she appeared disheveled and confused and family suspect that she had taken too many pills (patient had 120 pills of oxycodone filled December 18 and she only had 5 pills left and she also had a fentanyl patch on).  She was admitted for evaluation and management of acute toxic metabolic encephalopathy suspected due to medications, anemia/FOBT +, acute on chronic kidney disease due to dehydration and hypotension.    Assessment & Plan:   Principal Problem:   ARF (acute renal failure) (HCC) Active Problems:   Anemia   Cervical cancer (HCC)   CKD (chronic kidney disease) stage 4, GFR 15-29 ml/min (HCC)   Acute encephalopathy   Acute toxic metabolic encephalopathy -CT head negative for acute findings.  No focal deficits on exam. -UDS positive for opiates and benzodiazepines which she is on at home. -Suspected due  to medications/opioids, benzodiazepines, gabapentin, Duragesic patch and acute on chronic kidney disease. -Hold any medications that might affect mentation and treat acute kidney injury. -Monitor closely.  Mental status continues to improve. -ABG shows pH of 7.272, PCO2 32, PO2 85, bicarbonate 14.7.  Ammonia normal.  Dehydration/hypotension -Secondary to suspected poor oral intake.  IV fluids.  Oral intake when consistently awake and alert -Still having soft blood pressures but asymptomatic.  If persistent hypotension, may consider checking random cortisol.  Acute kidney injury on stage IV chronic kidney disease/bilateral hydronephrosis with nephrostomies -Creatinine on 12020-12-117: 3.19.  She presented with creatinine of 5.51 which has improved over several hours to 4.78.   -Suspected due to dehydration and moderate to severe bilateral hydronephrosis, right greater than left. -IV fluids. -Admitting physician discussed with urologist on call who recommended IR consult to interrogate the nephrostomy tube to ensure patency and may need replacement since last replacement was in September. -Follow BMP.  If urine output and creatinine does not improve then may need nephrology consultation. -I discussed with IR team who plan exchange of nephrostomies.  As per RN, noted some bloody urine through nephrostomy.  Non-anion gap metabolic acidosis -Secondary to renal insufficiency as noted above.  Treat as above and follow BMP daily.  Anemia/FOBT +. -Hemoglobin on 12020-12-117: 8.1.  She presented with hemoglobin of 6.3. -FOBT + but no report of overt bleeding. -Suspect worsening is due to chronic disease/chronic kidney disease. -Status post 2 units PRBC transfusion.  Hemoglobin has improved to 9.9. -Patient denies any overt source of bleeding.  As per RN, nephrostomy tube shows some hematuria.  Chronic pain -Hold all pain medications including gabapentin, Duragesic patch, oxycodone  and also  benzodiazepines to be held  Cervical cancer status post chemoradiation -Complicated by proctitis and has had recent takedown of Hartman's procedure.  Outpatient follow-up at [facility].  Asymptomatic bacteriuria -No fever, leukocytosis, normal lactate.  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  For now hold antibiotics but if she becomes symptomatic or reveals features of infection then consider antibiotics.  Discontinued empirically started Cipro.  Constipation Aggressive bowel regimen.    DVT prophylaxis: SCD Code Status: Full Family Communication: None at bedside Disposition: To be determined pending clinical improvement   Consultants:  Interventional radiology  Procedures:  Has bilateral percutaneous nephrostomies from prior to admission.  Antimicrobials:  None   Subjective: Patient interviewed and examined this morning in the ED with her female RN in room.  Still slightly somnolent but easily arousable and oriented x2.  Answered simple questions appropriately.  Reportedly lives alone, independent.  Denies tobacco or alcohol use.  States that she does not have a PCP and follows up with multiple physicians at [facility].  She reportedly follows with a pain MD there as well.  She denies any overt bleeding.  No vomiting or melena.  Denies dysuria, urinary frequency, fever or chills.  Reported some intermittent back pain.  ROS: As above, otherwise negative.  Objective:  Vitals:   01/20/18 0801 01/20/18 0830 01/20/18 0900 01/20/18 1230  BP: 121/86 (!) 86/64 (!) 87/63 116/90  Pulse: 96 74 72 92  Resp: 19 17 16 14   Temp: (!) 97.5 F (36.4 C)     TempSrc: Oral     SpO2: 100% 100% 99% 100%  Weight: 52 kg     Height: 5\' 2"  (1.575 m)       Examination:  General exam: Pleasant young female, small built and thinly nourished, frail and chronically ill looking lying comfortably supine in bed.  Oral mucosa dry. Respiratory system: Clear to  auscultation. Respiratory effort normal. Cardiovascular system: S1 & S2 heard, RRR. No JVD, murmurs, rubs, gallops or clicks. No pedal edema.  Telemetry personally reviewed: Sinus rhythm. Gastrointestinal system: Abdomen is nondistended, soft and nontender. No organomegaly or masses felt. Normal bowel sounds heard.  Laparotomy scars +. GU: Bilateral nephrostomy tubes intact without acute findings.  During my exam, there was no hematuria noted. Central nervous system: Mental status as above. No focal neurological deficits. Extremities: Symmetric 5 x 5 power. Skin: No rashes, lesions or ulcers Psychiatry: Judgement and insight appear impaired. Mood & affect flat.     Data Reviewed: I have personally reviewed following labs and imaging studies  CBC: Recent Labs  Lab 01/30/2018 2000 01/20/18 0329 01/20/18 1127  WBC 9.9 7.9 9.0  NEUTROABS 7.2 5.6 6.7  HGB 6.3* 6.3* 9.9*  HCT 22.1* 22.1* 33.0*  MCV 98.7 97.8 95.9  PLT 847* 623* 378*   Basic Metabolic Panel: Recent Labs  Lab 01/22/2018 2000 01/20/18 0329  NA 134* 138  K 4.5 4.0  CL 104 113*  CO2 16* 14*  GLUCOSE 98 89  BUN 69* 62*  CREATININE 5.51* 4.78*  CALCIUM 9.2 7.9*   Liver Function Tests: Recent Labs  Lab 01/12/2018 2000 01/20/18 0329  AST 20 16  ALT 9 6  ALKPHOS 67 51  BILITOT 0.6 0.9  PROT 7.5 5.8*  ALBUMIN 2.5* 1.9*   Cardiac Enzymes: Recent Labs  Lab 01/20/18 0749  CKTOTAL 118  TROPONINI <0.03     Recent Results (from the past 240 hour(s))  Blood Culture (routine x 2)  Status: None (Preliminary result)   Collection Time: 02/04/2018  7:30 PM  Result Value Ref Range Status   Specimen Description BLOOD LEFT ANTECUBITAL  Final   Special Requests   Final    BOTTLES DRAWN AEROBIC AND ANAEROBIC Blood Culture results may not be optimal due to an inadequate volume of blood received in culture bottles   Culture   Final    NO GROWTH < 12 HOURS Performed at [facility] [address] [place],  [place] [address]    Report Status PENDING  Incomplete  Blood Culture (routine x 2)     Status: None (Preliminary result)   Collection Time: 01/16/2018  7:45 PM  Result Value Ref Range Status   Specimen Description BLOOD RIGHT ANTECUBITAL  Final   Special Requests   Final    BOTTLES DRAWN AEROBIC ONLY Blood Culture results may not be optimal due to an inadequate volume of blood received in culture bottles   Culture   Final    NO GROWTH < 12 HOURS Performed at [facility], [place] [address] [place], [place] [address]    Report Status PENDING  Incomplete         Radiology Studies: Ct Head Wo Contrast  Result Date: 02/04/2018 CLINICAL DATA:  Altered level of consciousness (LOC), unexplained EXAM: CT HEAD WITHOUT CONTRAST TECHNIQUE: Contiguous axial images were obtained from the base of the skull through the vertex without intravenous contrast. COMPARISON:  None. FINDINGS: Brain: No intracranial hemorrhage, mass effect, or midline shift. No hydrocephalus. The basilar cisterns are patent. No evidence of territorial infarct or acute ischemia. No extra-axial or intracranial fluid collection. Vascular: No hyperdense vessel. Skull: No fracture or focal lesion. Sinuses/Orbits: Paranasal sinuses and mastoid air cells are clear. The visualized orbits are unremarkable. Other: None. IMPRESSION: Negative head CT. Electronically Signed   By: Keith Rake M.D.   On: 01/30/2018 20:41   US Renal  Result Date: 01/20/2018 CLINICAL DATA:  Initial evaluation for renal failure, bilateral nephrostomy tubes in place. EXAM: RENAL / URINARY TRACT ULTRASOUND COMPLETE COMPARISON:  None. FINDINGS: Right Kidney: Renal measurements: 8.4 x 5.0 x 5.0 cm = volume: 110 mL . Echogenicity grossly within normal limits. No mass lesion. Severe hydronephrosis. Nephrostomy tube partially visualize within the dilated right renal collecting system. Left Kidney: Renal measurements: 9.5 x 4.6 x 4.6 cm = volume: 107 mL. Echogenicity grossly  within normal limits. No lesion. Moderate left-sided hydronephrosis. Bladder: Not assessed on this examination. IMPRESSION: Moderate to severe bilateral hydronephrosis, right greater than left. Bedside check to evaluate nephrostomy tube function and patency recommended. Electronically Signed   By: Jeannine Boga M.D.   On: 01/20/2018 04:29   Dg Abdomen Acute W/chest  Result Date: 02/11/2018 CLINICAL DATA:  Hypotension with nephrostomy tubes and history of ovarian cancer. EXAM: DG ABDOMEN ACUTE W/ 1V CHEST COMPARISON:  None. FINDINGS: There is no evidence of dilated bowel loops or free intraperitoneal air. Moderate stool retention within the colon. Bilateral pigtail ureteral calculi are noted as well as bilateral external nephrostomy tubes without complicating features. No radiopaque calculi or other significant radiographic abnormality is seen. Heart size and mediastinal contours are within normal limits. Both lungs are clear. IMPRESSION: Moderate stool retention within the colon without bowel obstruction. Bilateral ureteral and nephrostomy tubes are in place without complicating features. No active pulmonary disease. Electronically Signed   By: Ashley Royalty M.D.   On: 02/09/2018 21:55        Scheduled Meds: Continuous  Infusions: . sodium chloride 100 mL/hr at 01/20/18 0321  . ciprofloxacin Stopped (01/20/18 1100)     LOS: 1 day     Vernell Leep, MD, FACP, [facility]. [facility] Pager [phone]  If 7PM-7AM, please contact night-coverage [email or web address] Password [email or web address] 01/20/2018, 1:29 PM

## 2018-01-20 NOTE — ED Notes (Signed)
Attempted report, bed not ready.

## 2018-01-21 ENCOUNTER — Inpatient Hospital Stay (HOSPITAL_COMMUNITY): Payer: PPO

## 2018-01-21 ENCOUNTER — Encounter (HOSPITAL_COMMUNITY): Payer: Self-pay | Admitting: Interventional Radiology

## 2018-01-21 HISTORY — PX: IR NEPHROSTOMY EXCHANGE RIGHT: IMG6070

## 2018-01-21 HISTORY — PX: IR NEPHROSTOMY EXCHANGE LEFT: IMG6069

## 2018-01-21 LAB — GLUCOSE, CAPILLARY
Glucose-Capillary: 66 mg/dL — ABNORMAL LOW (ref 70–99)
Glucose-Capillary: 73 mg/dL (ref 70–99)
Glucose-Capillary: 68 mg/dL — ABNORMAL LOW (ref 70–99)
Glucose-Capillary: 81 mg/dL (ref 70–99)
Glucose-Capillary: 91 mg/dL (ref 70–99)

## 2018-01-21 LAB — TYPE AND SCREEN
ABO/RH(D): A POS
Antibody Screen: NEGATIVE
Unit division: 0
Unit division: 0

## 2018-01-21 LAB — BASIC METABOLIC PANEL
Anion gap: 13 (ref 5–15)
BUN: 57 mg/dL — ABNORMAL HIGH (ref 6–20)
CO2: 12 mmol/L — ABNORMAL LOW (ref 22–32)
Calcium: 8.6 mg/dL — ABNORMAL LOW (ref 8.9–10.3)
Chloride: 119 mmol/L — ABNORMAL HIGH (ref 98–111)
Creatinine, Ser: 4.02 mg/dL — ABNORMAL HIGH (ref 0.44–1.00)
GFR calc non Af Amer: 13 mL/min — ABNORMAL LOW (ref >60)
GFR, EST AFRICAN AMERICAN: 15 mL/min — AB (ref >60)
Glucose, Bld: 86 mg/dL (ref 70–99)
Potassium: 4.7 mmol/L (ref 3.5–5.1)
Sodium: 144 mmol/L (ref 135–145)

## 2018-01-21 LAB — CBC
HCT: 29.3 % — ABNORMAL LOW (ref 36.0–46.0)
Hemoglobin: 8.9 g/dL — ABNORMAL LOW (ref 12.0–15.0)
MCH: 29.9 pg (ref 26.0–34.0)
MCHC: 30.4 g/dL (ref 30.0–36.0)
MCV: 98.3 fL (ref 80.0–100.0)
Platelets: 742 10*3/uL — ABNORMAL HIGH (ref 150–400)
RBC: 2.98 MIL/uL — ABNORMAL LOW (ref 3.87–5.11)
RDW: 16.7 % — ABNORMAL HIGH (ref 11.5–15.5)
WBC: 8.6 10*3/uL (ref 4.0–10.5)
nRBC: 0 % (ref 0.0–0.2)

## 2018-01-21 LAB — BPAM RBC
Blood Product Expiration Date: 202001282359
Blood Product Expiration Date: 202001282359
ISSUE DATE / TIME: 202001082302
ISSUE DATE / TIME: 202001090500
Unit Type and Rh: 6200
Unit Type and Rh: 6200

## 2018-01-21 MED ORDER — POLYETHYLENE GLYCOL 3350 17 G PO PACK
17.0000 g | PACK | Freq: Every day | ORAL | Status: DC
  Administered 2018-01-22: 17 g via ORAL
  Filled 2018-01-21 (×2): qty 1

## 2018-01-21 MED ORDER — LIDOCAINE HCL 1 % IJ SOLN
INTRAMUSCULAR | Status: AC
  Filled 2018-01-21: qty 20

## 2018-01-21 MED ORDER — IOPAMIDOL (ISOVUE-300) INJECTION 61%
INTRAVENOUS | Status: AC
  Administered 2018-01-21: 20 mL
  Filled 2018-01-21: qty 50

## 2018-01-21 MED ORDER — SODIUM BICARBONATE 8.4 % IV SOLN
INTRAVENOUS | Status: DC
  Administered 2018-01-21: 20:00:00 via INTRAVENOUS
  Filled 2018-01-21 (×2): qty 1000

## 2018-01-21 MED ORDER — BISACODYL 10 MG RE SUPP
10.0000 mg | Freq: Once | RECTAL | Status: AC
  Administered 2018-01-21: 10 mg via RECTAL
  Filled 2018-01-21: qty 1

## 2018-01-21 NOTE — Progress Notes (Signed)
PROGRESS NOTE   Katherine Kramer  BOF:[id number]    DOB: 08/10/1970    DOA: 01/25/2018  PCP: Patient, No Pcp Per   I have briefly reviewed patients previous medical records in [facility].  Brief Narrative:  As noted in care everywhere from most recent office visit at [facility] healthcare: "  48 y.o. 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) status post pelvic lymphadenectomy and oophoropexy (05/2012), chemoradiation (EBRT and HDR brachytherapy w/ weekly cisplatin, completed 07/7822); complicated by radiation proctitis, bilateral ureteral obstruction status post bilateral nephrostomy tubes (last changed 04/29/2017) and sigmoid perforation status post Hartmann's procedure (04/2015), colostomy dilatation for ostomy retraction (09/2015), colostomy revision (01/2016) and Hartmann's closure with diverting loop ileostomy (06/2016)" now presented to [facility] ED on 02/10/2018 after family and friends could not reach her over the phone for 2 days, when they found her, her urostomy tube was unconnected, the other tube looked dirty, she appeared disheveled and confused and family suspect that she had taken too many pills (patient had 120 pills of oxycodone filled December 18 and she only had 5 pills left and she also had a fentanyl patch on).  She was admitted for evaluation and management of acute toxic metabolic encephalopathy suspected due to medications, anemia/FOBT +, acute on chronic kidney disease due to dehydration and hypotension.  Mental status changes have improved but not completely resolved.  Renal insufficiency improving.    Assessment & Plan:   Principal Problem:   ARF (acute renal failure) (HCC) Active Problems:   Anemia   Cervical cancer (HCC)   CKD (chronic kidney disease) stage 4, GFR 15-29 ml/min (HCC)   Acute encephalopathy   Acute toxic metabolic encephalopathy -CT head negative for acute findings.  No focal deficits  on exam. -UDS positive for opiates and benzodiazepines which she is on at home. -Suspected due to medications/opioids, benzodiazepines, gabapentin, Duragesic patch and acute on chronic kidney disease. -Hold any medications that might affect mentation and treat acute kidney injury. -Monitor closely.  Mental status changes continues to improve but not completely resolved. -ABG shows pH of 7.272, PCO2 32, PO2 85, bicarbonate 14.7.  Ammonia normal.  Dehydration/hypotension -Secondary to suspected poor oral intake.  Continue IV fluids.  Diet as tolerated. -Hypotension seems to have resolved.  Acute kidney injury on stage IV chronic kidney disease/bilateral hydronephrosis with nephrostomies -Creatinine on 1October 04, 202019: 3.19.  She presented with creatinine of 5.51 which has improved over several hours to 4.78.   -Suspected due to dehydration and moderate to severe bilateral hydronephrosis, right greater than left. -IV fluids to continue. -Admitting physician discussed with urologist on call who recommended IR consult to interrogate the nephrostomy tube to ensure patency and may need replacement since last replacement was in September. -Follow BMP.  If urine output and creatinine does not improve then may need nephrology consultation. -IR performed exchange of bilateral percutaneous nephrostomy tubes on 1/10.  Internal double-J stents apparently were occluded.   -Creatinine slowly improving, down to 4.02.  Non-anion gap metabolic acidosis -Secondary to renal insufficiency as noted above.  Bicarbonate down to 12.  Changed IV fluids to bicarbonate drip.  Anemia/FOBT +. -Hemoglobin on 1October 04, 202019: 8.1.  She presented with hemoglobin of 6.3. -FOBT + but no report of overt bleeding. -Suspect worsening is due to chronic disease/chronic kidney disease. -Status post 2 units PRBC transfusion.  Hemoglobin has improved to 9.9 >8.9. -Patient denies any overt source of bleeding.  As per RN,  nephrostomy tube  shows some hematuria.  Chronic pain -Hold all pain medications including gabapentin, Duragesic patch, oxycodone and also benzodiazepines to be held.  No significant pain reported.  Cervical cancer status post chemoradiation -Complicated by proctitis and has had recent takedown of Hartman's procedure.  Outpatient follow-up at [facility].  Asymptomatic bacteriuria -No fever, leukocytosis, normal lactate.  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  For now hold antibiotics but if she becomes symptomatic or reveals features of infection then consider antibiotics.  Discontinued empirically started Cipro.  Constipation Aggressive bowel regimen.  Had multiple BMs overnight.    DVT prophylaxis: SCD Code Status: Full Family Communication: None at bedside Disposition: To be determined pending clinical improvement   Consultants:  Interventional radiology  Procedures:  Has bilateral percutaneous nephrostomies from prior to admission.  Antimicrobials:  None   Subjective: Patient interviewed and examined this morning with RN in room.  Wants to drink some Sprite.  Had multiple BMs overnight.  Denied pain.  No other complaints.  ROS: As above, otherwise negative.  Objective:  Vitals:   01/20/18 1541 01/20/18 2159 01/21/18 0609 01/21/18 1404  BP:  108/76 (!) 134/92 137/75  Pulse: 74 73 92 72  Resp:  17 16 16   Temp:  97.8 F (36.6 C) 97.9 F (36.6 C) (!) 97.2 F (36.2 C)  TempSrc:  Oral Oral   SpO2: 100% 100% 100% 100%  Weight:   46.6 kg   Height:        Examination:  General exam: Pleasant young female, small built and thinly nourished, frail and chronically ill looking lying comfortably supine in bed.  Oral mucosa with borderline hydration.  Looks improved compared to yesterday. Respiratory system: Clear to auscultation. Respiratory effort normal.  Stable. Cardiovascular system: S1 & S2 heard, RRR. No JVD, murmurs, rubs, gallops or clicks. No pedal  edema.  Telemetry personally reviewed: Sinus rhythm.  Stable. Gastrointestinal system: Abdomen is nondistended, soft and nontender. No organomegaly or masses felt. Normal bowel sounds heard.  Laparotomy scars +. GU: Bilateral nephrostomy tubes intact without acute findings. Central nervous system: Alert and oriented x2 but still appears slightly confused and at times answers inappropriately. No focal neurological deficits. Extremities: Symmetric 5 x 5 power. Skin: No rashes, lesions or ulcers Psychiatry: Judgement and insight appear impaired. Mood & affect flat.     Data Reviewed: I have personally reviewed following labs and imaging studies  CBC: Recent Labs  Lab 01/30/2018 2000 01/20/18 0329 01/20/18 1127 01/21/18 0745  WBC 9.9 7.9 9.0 8.6  NEUTROABS 7.2 5.6 6.7  --   HGB 6.3* 6.3* 9.9* 8.9*  HCT 22.1* 22.1* 33.0* 29.3*  MCV 98.7 97.8 95.9 98.3  PLT 847* 623* 774* 035*   Basic Metabolic Panel: Recent Labs  Lab 01/27/2018 2000 01/20/18 0329 01/21/18 0745  NA 134* 138 144  K 4.5 4.0 4.7  CL 104 113* 119*  CO2 16* 14* 12*  GLUCOSE 98 89 86  BUN 69* 62* 57*  CREATININE 5.51* 4.78* 4.02*  CALCIUM 9.2 7.9* 8.6*   Liver Function Tests: Recent Labs  Lab 01/17/2018 2000 01/20/18 0329  AST 20 16  ALT 9 6  ALKPHOS 67 51  BILITOT 0.6 0.9  PROT 7.5 5.8*  ALBUMIN 2.5* 1.9*   Cardiac Enzymes: Recent Labs  Lab 01/20/18 0749  CKTOTAL 118  TROPONINI <0.03     Recent Results (from the past 240 hour(s))  Blood Culture (routine x 2)     Status: None (  Preliminary result)   Collection Time: 02/03/2018  7:30 PM  Result Value Ref Range Status   Specimen Description BLOOD LEFT ANTECUBITAL  Final   Special Requests   Final    BOTTLES DRAWN AEROBIC AND ANAEROBIC Blood Culture results may not be optimal due to an inadequate volume of blood received in culture bottles   Culture   Final    NO GROWTH 2 DAYS Performed at [facility] [address] [place], [place] [address]     Report Status PENDING  Incomplete  Blood Culture (routine x 2)     Status: None (Preliminary result)   Collection Time: 02/06/2018  7:45 PM  Result Value Ref Range Status   Specimen Description BLOOD RIGHT ANTECUBITAL  Final   Special Requests   Final    BOTTLES DRAWN AEROBIC ONLY Blood Culture results may not be optimal due to an inadequate volume of blood received in culture bottles   Culture   Final    NO GROWTH 2 DAYS Performed at [facility], [place] [address], [place], [place] [address]    Report Status PENDING  Incomplete         Radiology Studies: Ct Head Wo Contrast  Result Date: 01/16/2018 CLINICAL DATA:  Altered level of consciousness (LOC), unexplained EXAM: CT HEAD WITHOUT CONTRAST TECHNIQUE: Contiguous axial images were obtained from the base of the skull through the vertex without intravenous contrast. COMPARISON:  None. FINDINGS: Brain: No intracranial hemorrhage, mass effect, or midline shift. No hydrocephalus. The basilar cisterns are patent. No evidence of territorial infarct or acute ischemia. No extra-axial or intracranial fluid collection. Vascular: No hyperdense vessel. Skull: No fracture or focal lesion. Sinuses/Orbits: Paranasal sinuses and mastoid air cells are clear. The visualized orbits are unremarkable. Other: None. IMPRESSION: Negative head CT. Electronically Signed   By: Keith Rake M.D.   On: 02/03/2018 20:41   US Renal  Result Date: 01/20/2018 CLINICAL DATA:  Initial evaluation for renal failure, bilateral nephrostomy tubes in place. EXAM: RENAL / URINARY TRACT ULTRASOUND COMPLETE COMPARISON:  None. FINDINGS: Right Kidney: Renal measurements: 8.4 x 5.0 x 5.0 cm = volume: 110 mL . Echogenicity grossly within normal limits. No mass lesion. Severe hydronephrosis. Nephrostomy tube partially visualize within the dilated right renal collecting system. Left Kidney: Renal measurements: 9.5 x 4.6 x 4.6 cm = volume: 107 mL. Echogenicity grossly within normal  limits. No lesion. Moderate left-sided hydronephrosis. Bladder: Not assessed on this examination. IMPRESSION: Moderate to severe bilateral hydronephrosis, right greater than left. Bedside check to evaluate nephrostomy tube function and patency recommended. Electronically Signed   By: Jeannine Boga M.D.   On: 01/20/2018 04:29   Dg Abdomen Acute W/chest  Result Date: 01/26/2018 CLINICAL DATA:  Hypotension with nephrostomy tubes and history of ovarian cancer. EXAM: DG ABDOMEN ACUTE W/ 1V CHEST COMPARISON:  None. FINDINGS: There is no evidence of dilated bowel loops or free intraperitoneal air. Moderate stool retention within the colon. Bilateral pigtail ureteral calculi are noted as well as bilateral external nephrostomy tubes without complicating features. No radiopaque calculi or other significant radiographic abnormality is seen. Heart size and mediastinal contours are within normal limits. Both lungs are clear. IMPRESSION: Moderate stool retention within the colon without bowel obstruction. Bilateral ureteral and nephrostomy tubes are in place without complicating features. No active pulmonary disease. Electronically Signed   By: Ashley Royalty M.D.   On: 01/21/2018 21:55   Ir Nephrostomy Exchange Left  Result Date: 01/21/2018 INDICATION: 48 year old female with  a history of cervical cancer and bilateral ureteral obstruction. She has bilateral percutaneous nephrostomy tubes which were placed at another institution. They have not been changed since September of 2019. She is currently admitted as an inpatient and presents for nephrostomy tube check and exchange. EXAM: Nephrostomy tube exchange, right Nephrostomy tube exchange, left COMPARISON:  None. MEDICATIONS: None ANESTHESIA/SEDATION: None CONTRAST:  10 mL Isovue 370-administered into the collecting system(s) FLUOROSCOPY TIME:  Fluoroscopy Time: 1 minutes 6 seconds (1.8 mGy). COMPLICATIONS: None immediate. PROCEDURE: Informed written consent was  obtained from the patient after a thorough discussion of the procedural risks, benefits and alternatives. All questions were addressed. Maximal Sterile Barrier Technique was utilized including caps, mask, sterile gowns, sterile gloves, sterile drape, hand hygiene and skin antiseptic. A timeout was performed prior to the initiation of the procedure. Attention was first turned to the left percutaneous nephrostomy tube. A gentle hand injection of contrast material was performed. The tube is located within a lower pole calyx, just within the collecting system. The tube was transected and a Bentson wire successfully navigated through the tube and back into the renal pelvis. The tube was removed over the wire. A new 10 [place] cook all-purpose drainage catheter was advanced over the wire and formed in the renal pelvis. An image was obtained and stored for the medical record. The tube was connected to gravity bag drainage. Attention was next turned to the right percutaneous nephrostomy tube. A gentle hand injection of contrast material was performed. The tube is present within the upper pole infundibulum. The tube was transected and removed over a Bentson wire. A new Cook 10 French percutaneous nephrostomy tube was advanced over the wire into the renal pelvis where it was successfully formed. An image was obtained and stored for the medical record. Overall, the patient tolerated the procedure well. Of note, there are bilateral double-J ureteral stents which appear to be occluded. IMPRESSION: Successful exchange of bilateral percutaneous nephrostomy tubes. The bilateral internal double-J ureteral stents appear to be occluded. PLAN: Return to interventional Radiology in 8 weeks for scheduled bilateral nephrostomy tube exchange. Signed, Criselda Peaches, MD, [facility] Electronically Signed   By: Jacqulynn Cadet M.D.   On: 01/21/2018 13:09   Ir Nephrostomy  Exchange Right  Result Date: 01/21/2018 INDICATION: 48 year old female with a history of cervical cancer and bilateral ureteral obstruction. She has bilateral percutaneous nephrostomy tubes which were placed at another institution. They have not been changed since September of 2019. She is currently admitted as an inpatient and presents for nephrostomy tube check and exchange. EXAM: Nephrostomy tube exchange, right Nephrostomy tube exchange, left COMPARISON:  None. MEDICATIONS: None ANESTHESIA/SEDATION: None CONTRAST:  10 mL Isovue 370-administered into the collecting system(s) FLUOROSCOPY TIME:  Fluoroscopy Time: 1 minutes 6 seconds (1.8 mGy). COMPLICATIONS: None immediate. PROCEDURE: Informed written consent was obtained from the patient after a thorough discussion of the procedural risks, benefits and alternatives. All questions were addressed. Maximal Sterile Barrier Technique was utilized including caps, mask, sterile gowns, sterile gloves, sterile drape, hand hygiene and skin antiseptic. A timeout was performed prior to the initiation of the procedure. Attention was first turned to the left percutaneous nephrostomy tube. A gentle hand injection of contrast material was performed. The tube is located within a lower pole calyx, just within the collecting system. The tube was transected and a Bentson wire successfully navigated through the tube and back into the renal pelvis. The tube was removed over the wire.  A new 10 [place] cook all-purpose drainage catheter was advanced over the wire and formed in the renal pelvis. An image was obtained and stored for the medical record. The tube was connected to gravity bag drainage. Attention was next turned to the right percutaneous nephrostomy tube. A gentle hand injection of contrast material was performed. The tube is present within the upper pole infundibulum. The tube was transected and removed over a Bentson wire. A new Cook 10 French percutaneous nephrostomy tube  was advanced over the wire into the renal pelvis where it was successfully formed. An image was obtained and stored for the medical record. Overall, the patient tolerated the procedure well. Of note, there are bilateral double-J ureteral stents which appear to be occluded. IMPRESSION: Successful exchange of bilateral percutaneous nephrostomy tubes. The bilateral internal double-J ureteral stents appear to be occluded. PLAN: Return to interventional Radiology in 8 weeks for scheduled bilateral nephrostomy tube exchange. Signed, Criselda Peaches, MD, [facility] Electronically Signed   By: Jacqulynn Cadet M.D.   On: 01/21/2018 13:09        Scheduled Meds: . lidocaine       Continuous Infusions:    LOS: 2 days     Vernell Leep, MD, FACP, [facility]. [facility] Pager [phone]  If 7PM-7AM, please contact night-coverage [email or web address] Password [facility] 01/21/2018, 6:29 PM

## 2018-01-21 NOTE — Care Management Important Message (Signed)
Important Message  Patient Details  Name: Katherine Kramer MRN: [id number] Date of Birth: February 17, 1970   Medicare Important Message Given:  Yes    [name] 01/21/2018, 3:44 PM

## 2018-01-21 NOTE — Progress Notes (Signed)
Pt refused SCD stated she walk to bathroom.

## 2018-01-22 DIAGNOSIS — E872 Acidosis: Secondary | ICD-10-CM

## 2018-01-22 LAB — BASIC METABOLIC PANEL
Anion gap: 10 (ref 5–15)
Anion gap: 12 (ref 5–15)
BUN: 58 mg/dL — ABNORMAL HIGH (ref 6–20)
BUN: 56 mg/dL — ABNORMAL HIGH (ref 6–20)
CO2: 15 mmol/L — ABNORMAL LOW (ref 22–32)
CO2: 17 mmol/L — ABNORMAL LOW (ref 22–32)
CREATININE: 3.58 mg/dL — AB (ref 0.44–1.00)
Calcium: 8.2 mg/dL — ABNORMAL LOW (ref 8.9–10.3)
Calcium: 8.3 mg/dL — ABNORMAL LOW (ref 8.9–10.3)
Chloride: 118 mmol/L — ABNORMAL HIGH (ref 98–111)
Chloride: 115 mmol/L — ABNORMAL HIGH (ref 98–111)
Creatinine, Ser: 3.78 mg/dL — ABNORMAL HIGH (ref 0.44–1.00)
GFR calc Af Amer: 16 mL/min — ABNORMAL LOW (ref >60)
GFR calc Af Amer: 17 mL/min — ABNORMAL LOW (ref >60)
GFR calc non Af Amer: 15 mL/min — ABNORMAL LOW (ref >60)
GFR, EST NON AFRICAN AMERICAN: 14 mL/min — AB (ref >60)
Glucose, Bld: 101 mg/dL — ABNORMAL HIGH (ref 70–99)
Glucose, Bld: 105 mg/dL — ABNORMAL HIGH (ref 70–99)
Potassium: 3.9 mmol/L (ref 3.5–5.1)
Potassium: 3.6 mmol/L (ref 3.5–5.1)
Sodium: 143 mmol/L (ref 135–145)
Sodium: 144 mmol/L (ref 135–145)

## 2018-01-22 LAB — CBC
HCT: 23.3 % — ABNORMAL LOW (ref 36.0–46.0)
HCT: 23.9 % — ABNORMAL LOW (ref 36.0–46.0)
Hemoglobin: 7.4 g/dL — ABNORMAL LOW (ref 12.0–15.0)
Hemoglobin: 7.6 g/dL — ABNORMAL LOW (ref 12.0–15.0)
MCH: 29.7 pg (ref 26.0–34.0)
MCH: 28.9 pg (ref 26.0–34.0)
MCHC: 31.8 g/dL (ref 30.0–36.0)
MCHC: 31.8 g/dL (ref 30.0–36.0)
MCV: 93.6 fL (ref 80.0–100.0)
MCV: 90.9 fL (ref 80.0–100.0)
Platelets: 616 10*3/uL — ABNORMAL HIGH (ref 150–400)
Platelets: 625 10*3/uL — ABNORMAL HIGH (ref 150–400)
RBC: 2.49 MIL/uL — ABNORMAL LOW (ref 3.87–5.11)
RBC: 2.63 MIL/uL — ABNORMAL LOW (ref 3.87–5.11)
RDW: 16.4 % — AB (ref 11.5–15.5)
RDW: 16.2 % — AB (ref 11.5–15.5)
WBC: 7.4 10*3/uL (ref 4.0–10.5)
WBC: 9.5 10*3/uL (ref 4.0–10.5)
nRBC: 0 % (ref 0.0–0.2)
nRBC: 0 % (ref 0.0–0.2)

## 2018-01-22 LAB — GLUCOSE, CAPILLARY
Glucose-Capillary: 94 mg/dL (ref 70–99)
Glucose-Capillary: 112 mg/dL — ABNORMAL HIGH (ref 70–99)

## 2018-01-22 MED ORDER — SODIUM BICARBONATE 8.4 % IV SOLN
INTRAVENOUS | Status: DC
  Filled 2018-01-22: qty 850

## 2018-01-22 MED ORDER — SODIUM BICARBONATE 8.4 % IV SOLN
INTRAVENOUS | Status: DC
  Administered 2018-01-22: 08:00:00 via INTRAVENOUS
  Filled 2018-01-22: qty 1000

## 2018-01-22 NOTE — Progress Notes (Signed)
PROGRESS NOTE   Katherine Kramer  OIB:[id number]    DOB: November 12, 1970    DOA: 02/07/2018  PCP: Patient, No Pcp Per   I have briefly reviewed patients previous medical records in [facility].  Brief Narrative:  As noted in care everywhere from most recent office visit at [facility]: "  47 y.o. 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) status post pelvic lymphadenectomy and oophoropexy (05/2012), chemoradiation (EBRT and HDR brachytherapy w/ weekly cisplatin, completed 09/4501); complicated by radiation proctitis, bilateral ureteral obstruction status post bilateral nephrostomy tubes (last changed 04/29/2017) and sigmoid perforation status post Hartmann's procedure (04/2015), colostomy dilatation for ostomy retraction (09/2015), colostomy revision (01/2016) and Hartmann's closure with diverting loop ileostomy (06/2016)" now presented to [facility] ED on 01/12/2018 after family and friends could not reach her over the phone for 2 days, when they found her, her urostomy tube was unconnected, the other tube looked dirty, she appeared disheveled and confused and family suspect that she had taken too many pills (patient had 120 pills of oxycodone filled December 18 and she only had 5 pills left and she also had a fentanyl patch on).  She was admitted for evaluation and management of acute toxic metabolic encephalopathy suspected due to medications, anemia/FOBT +, acute on chronic kidney disease due to dehydration and hypotension.  Mental status changes have improved but not completely resolved.  Renal insufficiency improving.    Assessment & Plan:   Principal Problem:   ARF (acute renal failure) (HCC) Active Problems:   Anemia   Cervical cancer (HCC)   CKD (chronic kidney disease) stage 4, GFR 15-29 ml/min (HCC)   Acute encephalopathy   Acute toxic metabolic encephalopathy -CT head negative for acute findings.  No focal deficits  on exam. -UDS positive for opiates and benzodiazepines which she is on at home. -Suspected due to medications/opioids, benzodiazepines, gabapentin, Duragesic patch and acute on chronic kidney disease. -Hold any medications that might affect mentation and treat acute kidney injury. -ABG shows pH of 7.272, PCO2 32, PO2 85, bicarbonate 14.7.  Ammonia normal. -Although mental status has improved, still not completely resolved and has intermittent confusion as per RN.  Dehydration/hypotension -Secondary to suspected poor oral intake.  Continue IV fluids.  Diet as tolerated. -Hypotension resolved.  Acute kidney injury on stage IV chronic kidney disease/bilateral hydronephrosis with nephrostomies -Creatinine on 103/30/2019: 3.19.  She presented with creatinine of 5.51 which has improved over several hours to 4.78.   -Suspected due to dehydration and moderate to severe bilateral hydronephrosis, right greater than left. -IV fluids to continue. -Admitting physician discussed with urologist on call who recommended IR consult to interrogate the nephrostomy tube to ensure patency and may need replacement since last replacement was in September. -Follow BMP.  If urine output and creatinine does not improve then may need nephrology consultation. -IR performed exchange of bilateral percutaneous nephrostomy tubes on 1/10.  Internal double-J stents apparently were occluded.   -Transient hematuria through percutaneous nephrostomy tubes postprocedure, now resolved.  Good urine output.  Creatinine down to 3.78.  Continue bicarbonate drip and follow BMP closely.  Non-anion gap metabolic acidosis -Secondary to renal insufficiency as noted above.  Bicarbonate down to 12.  Changed IV fluids to bicarbonate drip, slowly improving, continue.  Anemia/FOBT +. -Hemoglobin on 103/30/2019: 8.1.  She presented with hemoglobin of 6.3. -FOBT + but no report of overt bleeding. -Suspect worsening is due to chronic disease/chronic  kidney disease. -  Status post 2 units PRBC transfusion.  Hemoglobin has improved to 9.9 >8.9. -Hemoglobin down to 7.4 this morning.  Apart from transient hematuria and PC nephrostomies which has resolved, no other overt bleeding.  Normal colored BM.  Follow CBC later this evening and transfuse PRBC if hemoglobin 7 or less.  Chronic pain -Hold all pain medications including gabapentin, Duragesic patch, oxycodone and also benzodiazepines to be held.  No significant pain reported.  Cervical cancer status post chemoradiation -Complicated by proctitis and has had recent takedown of Hartman's procedure.  Outpatient follow-up at [facility].  Asymptomatic bacteriuria -No fever, leukocytosis, normal lactate.  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  For now hold antibiotics but if she becomes symptomatic or reveals features of infection then consider antibiotics.  Discontinued empirically started Cipro.  Constipation Aggressive bowel regimen.  Continues to have BMs.    DVT prophylaxis: SCD Code Status: Full Family Communication: None at bedside Disposition: To be determined pending clinical improvement   Consultants:  Interventional radiology  Procedures:  Had bilateral percutaneous nephrostomies from prior to admission. IR performed exchange of bilateral percutaneous nephrostomy tubes on 1/10.  Antimicrobials:  None   Subjective: Denies complaints.  Does not say much.  "I am okay".  No pain reported.  Having normal colored BMs.  As per RN, still has intermittent confusion, impulsive and gets out of bed without informing.  Has safety sitter at bedside.  ROS: As above, otherwise negative.  Objective:  Vitals:   01/21/18 0609 01/21/18 1404 01/21/18 2051 01/22/18 0603  BP: (!) 134/92 137/75 (!) 144/82 (!) 137/94  Pulse: 92 72 68 73  Resp: 16 16 18 18   Temp: 97.9 F (36.6 C) (!) 97.2 F (36.2 C) 98 F (36.7 C) 98.3 F (36.8 C)  TempSrc: Oral  Oral Oral    SpO2: 100% 100% 100% 100%  Weight: 46.6 kg     Height:        Examination:  General exam: Pleasant young female, small built and thinly nourished, frail and chronically ill looking lying comfortably supine in bed.  Oral mucosa moist. Respiratory system: Clear to auscultation. Respiratory effort normal.  Stable. Cardiovascular system: S1 & S2 heard, RRR. No JVD, murmurs, rubs, gallops or clicks. No pedal edema.  Telemetry personally reviewed: Sinus rhythm. Gastrointestinal system: Abdomen is nondistended, soft and nontender. No organomegaly or masses felt. Normal bowel sounds heard.  Laparotomy scars +. GU: Bilateral nephrostomy tubes intact without acute findings and draining brown-colored urine, no gross hematuria. Central nervous system: Alert and oriented x2 but still appears slightly confused and at times answers inappropriately. No focal neurological deficits. Extremities: Symmetric 5 x 5 power. Skin: No rashes, lesions or ulcers Psychiatry: Judgement and insight appear impaired. Mood & affect flat.     Data Reviewed: I have personally reviewed following labs and imaging studies  CBC: Recent Labs  Lab 02/01/2018 2000 01/20/18 0329 01/20/18 1127 01/21/18 0745 01/22/18 0417  WBC 9.9 7.9 9.0 8.6 7.4  NEUTROABS 7.2 5.6 6.7  --   --   HGB 6.3* 6.3* 9.9* 8.9* 7.4*  HCT 22.1* 22.1* 33.0* 29.3* 23.3*  MCV 98.7 97.8 95.9 98.3 93.6  PLT 847* 623* 774* 742* 834*   Basic Metabolic Panel: Recent Labs  Lab 01/16/2018 2000 01/20/18 0329 01/21/18 0745 01/22/18 0417  NA 134* 138 144 143  K 4.5 4.0 4.7 3.9  CL 104 113* 119* 118*  CO2 16* 14* 12* 15*  GLUCOSE 98 89 86 101*  BUN 69* 62* 57* 58*  CREATININE 5.51* 4.78* 4.02* 3.78*  CALCIUM 9.2 7.9* 8.6* 8.2*   Liver Function Tests: Recent Labs  Lab 02/08/2018 2000 01/20/18 0329  AST 20 16  ALT 9 6  ALKPHOS 67 51  BILITOT 0.6 0.9  PROT 7.5 5.8*  ALBUMIN 2.5* 1.9*   Cardiac Enzymes: Recent Labs  Lab 01/20/18 0749   CKTOTAL 118  TROPONINI <0.03     Recent Results (from the past 240 hour(s))  Blood Culture (routine x 2)     Status: None (Preliminary result)   Collection Time: 01/25/2018  7:30 PM  Result Value Ref Range Status   Specimen Description BLOOD LEFT ANTECUBITAL  Final   Special Requests   Final    BOTTLES DRAWN AEROBIC AND ANAEROBIC Blood Culture results may not be optimal due to an inadequate volume of blood received in culture bottles   Culture   Final    NO GROWTH 3 DAYS Performed at [facility], [place] [address] [place] [address], [place], [place] [address]    Report Status PENDING  Incomplete  Blood Culture (routine x 2)     Status: None (Preliminary result)   Collection Time: 01/18/2018  7:45 PM  Result Value Ref Range Status   Specimen Description BLOOD RIGHT ANTECUBITAL  Final   Special Requests   Final    BOTTLES DRAWN AEROBIC ONLY Blood Culture results may not be optimal due to an inadequate volume of blood received in culture bottles   Culture   Final    NO GROWTH 3 DAYS Performed at [facility], [place] [address], [place], [place] [address]    Report Status PENDING  Incomplete         Radiology Studies: Ir Nephrostomy Exchange Left  Result Date: 01/21/2018 INDICATION: 48 year old female with a history of cervical cancer and bilateral ureteral obstruction. She has bilateral percutaneous nephrostomy tubes which were placed at another institution. They have not been changed since September of 2019. She is currently admitted as an inpatient and presents for nephrostomy tube check and exchange. EXAM: Nephrostomy tube exchange, right Nephrostomy tube exchange, left COMPARISON:  None. MEDICATIONS: None ANESTHESIA/SEDATION: None CONTRAST:  10 mL Isovue 370-administered into the collecting system(s) FLUOROSCOPY TIME:  Fluoroscopy Time: 1 minutes 6 seconds (1.8 mGy). COMPLICATIONS: None immediate. PROCEDURE: Informed written consent was obtained from the patient after a thorough discussion  of the procedural risks, benefits and alternatives. All questions were addressed. Maximal Sterile Barrier Technique was utilized including caps, mask, sterile gowns, sterile gloves, sterile drape, hand hygiene and skin antiseptic. A timeout was performed prior to the initiation of the procedure. Attention was first turned to the left percutaneous nephrostomy tube. A gentle hand injection of contrast material was performed. The tube is located within a lower pole calyx, just within the collecting system. The tube was transected and a Bentson wire successfully navigated through the tube and back into the renal pelvis. The tube was removed over the wire. A new 10 [place] cook all-purpose drainage catheter was advanced over the wire and formed in the renal pelvis. An image was obtained and stored for the medical record. The tube was connected to gravity bag drainage. Attention was next turned to the right percutaneous nephrostomy tube. A gentle hand injection of contrast material was performed. The tube is present within the upper pole infundibulum. The tube was transected and removed over a Bentson wire. A new Cook 10 French percutaneous nephrostomy tube was advanced over the wire into  the renal pelvis where it was successfully formed. An image was obtained and stored for the medical record. Overall, the patient tolerated the procedure well. Of note, there are bilateral double-J ureteral stents which appear to be occluded. IMPRESSION: Successful exchange of bilateral percutaneous nephrostomy tubes. The bilateral internal double-J ureteral stents appear to be occluded. PLAN: Return to interventional Radiology in 8 weeks for scheduled bilateral nephrostomy tube exchange. Signed, Criselda Peaches, MD, [facility] Radiology Electronically Signed   By: Jacqulynn Cadet M.D.   On: 01/21/2018 13:09   Ir Nephrostomy Exchange Right  Result Date: 01/21/2018 INDICATION:  48 year old female with a history of cervical cancer and bilateral ureteral obstruction. She has bilateral percutaneous nephrostomy tubes which were placed at another institution. They have not been changed since September of 2019. She is currently admitted as an inpatient and presents for nephrostomy tube check and exchange. EXAM: Nephrostomy tube exchange, right Nephrostomy tube exchange, left COMPARISON:  None. MEDICATIONS: None ANESTHESIA/SEDATION: None CONTRAST:  10 mL Isovue 370-administered into the collecting system(s) FLUOROSCOPY TIME:  Fluoroscopy Time: 1 minutes 6 seconds (1.8 mGy). COMPLICATIONS: None immediate. PROCEDURE: Informed written consent was obtained from the patient after a thorough discussion of the procedural risks, benefits and alternatives. All questions were addressed. Maximal Sterile Barrier Technique was utilized including caps, mask, sterile gowns, sterile gloves, sterile drape, hand hygiene and skin antiseptic. A timeout was performed prior to the initiation of the procedure. Attention was first turned to the left percutaneous nephrostomy tube. A gentle hand injection of contrast material was performed. The tube is located within a lower pole calyx, just within the collecting system. The tube was transected and a Bentson wire successfully navigated through the tube and back into the renal pelvis. The tube was removed over the wire. A new 10 [place] cook all-purpose drainage catheter was advanced over the wire and formed in the renal pelvis. An image was obtained and stored for the medical record. The tube was connected to gravity bag drainage. Attention was next turned to the right percutaneous nephrostomy tube. A gentle hand injection of contrast material was performed. The tube is present within the upper pole infundibulum. The tube was transected and removed over a Bentson wire. A new Cook 10 French percutaneous nephrostomy tube was advanced over the wire into the renal pelvis where  it was successfully formed. An image was obtained and stored for the medical record. Overall, the patient tolerated the procedure well. Of note, there are bilateral double-J ureteral stents which appear to be occluded. IMPRESSION: Successful exchange of bilateral percutaneous nephrostomy tubes. The bilateral internal double-J ureteral stents appear to be occluded. PLAN: Return to interventional Radiology in 8 weeks for scheduled bilateral nephrostomy tube exchange. Signed, Criselda Peaches, MD, [facility] Radiology Electronically Signed   By: Jacqulynn Cadet M.D.   On: 01/21/2018 13:09        Scheduled Meds: . polyethylene glycol  17 g Oral Daily   Continuous Infusions:    LOS: 3 days     Vernell Leep, MD, FACP, [facility]. [facility] Pager [phone]  If 7PM-7AM, please contact night-coverage [email or web address] Password [facility] 01/22/2018, 2:43 PM

## 2018-01-23 ENCOUNTER — Inpatient Hospital Stay (HOSPITAL_COMMUNITY): Payer: PPO

## 2018-01-23 ENCOUNTER — Encounter (HOSPITAL_COMMUNITY): Admission: EM | Disposition: E | Payer: Self-pay | Source: Home / Self Care | Attending: Internal Medicine

## 2018-01-23 ENCOUNTER — Inpatient Hospital Stay (HOSPITAL_COMMUNITY): Payer: PPO | Admitting: Certified Registered Nurse Anesthetist

## 2018-01-23 ENCOUNTER — Encounter (HOSPITAL_COMMUNITY): Payer: Self-pay

## 2018-01-23 DIAGNOSIS — N184 Chronic kidney disease, stage 4 (severe): Secondary | ICD-10-CM

## 2018-01-23 DIAGNOSIS — F32A Depression, unspecified: Secondary | ICD-10-CM | POA: Insufficient documentation

## 2018-01-23 DIAGNOSIS — N135 Crossing vessel and stricture of ureter without hydronephrosis: Secondary | ICD-10-CM

## 2018-01-23 DIAGNOSIS — G9340 Encephalopathy, unspecified: Secondary | ICD-10-CM

## 2018-01-23 DIAGNOSIS — Z923 Personal history of irradiation: Secondary | ICD-10-CM | POA: Insufficient documentation

## 2018-01-23 DIAGNOSIS — K920 Hematemesis: Secondary | ICD-10-CM

## 2018-01-23 DIAGNOSIS — F419 Anxiety disorder, unspecified: Secondary | ICD-10-CM | POA: Insufficient documentation

## 2018-01-23 DIAGNOSIS — Z978 Presence of other specified devices: Secondary | ICD-10-CM

## 2018-01-23 DIAGNOSIS — R578 Other shock: Secondary | ICD-10-CM

## 2018-01-23 DIAGNOSIS — I469 Cardiac arrest, cause unspecified: Secondary | ICD-10-CM

## 2018-01-23 DIAGNOSIS — D649 Anemia, unspecified: Secondary | ICD-10-CM

## 2018-01-23 DIAGNOSIS — F329 Major depressive disorder, single episode, unspecified: Secondary | ICD-10-CM | POA: Insufficient documentation

## 2018-01-23 DIAGNOSIS — R4182 Altered mental status, unspecified: Secondary | ICD-10-CM

## 2018-01-23 DIAGNOSIS — N179 Acute kidney failure, unspecified: Secondary | ICD-10-CM

## 2018-01-23 DIAGNOSIS — K922 Gastrointestinal hemorrhage, unspecified: Secondary | ICD-10-CM

## 2018-01-23 HISTORY — PX: RADIOLOGY WITH ANESTHESIA: SHX6223

## 2018-01-23 HISTORY — PX: IR US GUIDE VASC ACCESS RIGHT: IMG2390

## 2018-01-23 HISTORY — PX: IR EMBO ART  VEN HEMORR LYMPH EXTRAV  INC GUIDE ROADMAPPING: IMG5450

## 2018-01-23 HISTORY — PX: IR VENO/EXT/UNI RIGHT: IMG676

## 2018-01-23 HISTORY — PX: FLEXIBLE SIGMOIDOSCOPY: SHX5431

## 2018-01-23 HISTORY — PX: IR ANGIOGRAM VISCERAL SELECTIVE: IMG657

## 2018-01-23 HISTORY — PX: ESOPHAGOGASTRODUODENOSCOPY: SHX5428

## 2018-01-23 LAB — POCT I-STAT 3, ART BLOOD GAS (G3+)
ACID-BASE DEFICIT: 17 mmol/L — AB (ref 0.0–2.0)
Acid-Base Excess: 1 mmol/L (ref 0.0–2.0)
Acid-base deficit: 11 mmol/L — ABNORMAL HIGH (ref 0.0–2.0)
Acid-base deficit: 14 mmol/L — ABNORMAL HIGH (ref 0.0–2.0)
Acid-base deficit: 17 mmol/L — ABNORMAL HIGH (ref 0.0–2.0)
Acid-base deficit: 21 mmol/L — ABNORMAL HIGH (ref 0.0–2.0)
BICARBONATE: 14.5 mmol/L — AB (ref 20.0–28.0)
BICARBONATE: 11.1 mmol/L — AB (ref 20.0–28.0)
Bicarbonate: 10.3 mmol/L — ABNORMAL LOW (ref 20.0–28.0)
Bicarbonate: 9.8 mmol/L — ABNORMAL LOW (ref 20.0–28.0)
Bicarbonate: 11.6 mmol/L — ABNORMAL LOW (ref 20.0–28.0)
Bicarbonate: 29.7 mmol/L — ABNORMAL HIGH (ref 20.0–28.0)
O2 Saturation: 100 %
O2 Saturation: 61 %
O2 Saturation: 100 %
O2 Saturation: 100 %
O2 Saturation: 66 %
O2 Saturation: 82 %
PO2 ART: 40 mmHg — AB (ref 83.0–108.0)
Patient temperature: 99
Patient temperature: 96.7
Patient temperature: 95
TCO2: 15 mmol/L — ABNORMAL LOW (ref 22–32)
TCO2: 11 mmol/L — ABNORMAL LOW (ref 22–32)
TCO2: 12 mmol/L — ABNORMAL LOW (ref 22–32)
TCO2: 11 mmol/L — ABNORMAL LOW (ref 22–32)
TCO2: 13 mmol/L — ABNORMAL LOW (ref 22–32)
TCO2: 32 mmol/L (ref 22–32)
pCO2 arterial: 27.6 mmHg — ABNORMAL LOW (ref 32.0–48.0)
pCO2 arterial: 30.2 mmHg — ABNORMAL LOW (ref 32.0–48.0)
pCO2 arterial: 21.1 mmHg — ABNORMAL LOW (ref 32.0–48.0)
pCO2 arterial: 24.5 mmHg — ABNORMAL LOW (ref 32.0–48.0)
pCO2 arterial: 51.1 mmHg — ABNORMAL HIGH (ref 32.0–48.0)
pCO2 arterial: 60.9 mmHg — ABNORMAL HIGH (ref 32.0–48.0)
pH, Arterial: 7.328 — ABNORMAL LOW (ref 7.350–7.450)
pH, Arterial: 7.14 — CL (ref 7.350–7.450)
pH, Arterial: 7.325 — ABNORMAL LOW (ref 7.350–7.450)
pH, Arterial: 7.205 — ABNORMAL LOW (ref 7.350–7.450)
pH, Arterial: 6.951 — CL (ref 7.350–7.450)
pH, Arterial: 7.286 — ABNORMAL LOW (ref 7.350–7.450)
pO2, Arterial: 450 mmHg — ABNORMAL HIGH (ref 83.0–108.0)
pO2, Arterial: 309 mmHg — ABNORMAL HIGH (ref 83.0–108.0)
pO2, Arterial: 527 mmHg — ABNORMAL HIGH (ref 83.0–108.0)
pO2, Arterial: 48 mmHg — ABNORMAL LOW (ref 83.0–108.0)
pO2, Arterial: 48 mmHg — ABNORMAL LOW (ref 83.0–108.0)

## 2018-01-23 LAB — CBC
HCT: 22 % — ABNORMAL LOW (ref 36.0–46.0)
HCT: 40.5 % (ref 36.0–46.0)
HCT: 42.5 % (ref 36.0–46.0)
HCT: 47.5 % — ABNORMAL HIGH (ref 36.0–46.0)
HCT: 51.8 % — ABNORMAL HIGH (ref 36.0–46.0)
HEMATOCRIT: 22.7 % — AB (ref 36.0–46.0)
HEMATOCRIT: 19.4 % — AB (ref 36.0–46.0)
Hemoglobin: 13.6 g/dL (ref 12.0–15.0)
Hemoglobin: 14.5 g/dL (ref 12.0–15.0)
Hemoglobin: 15.3 g/dL — ABNORMAL HIGH (ref 12.0–15.0)
Hemoglobin: 17.6 g/dL — ABNORMAL HIGH (ref 12.0–15.0)
Hemoglobin: 6 g/dL — CL (ref 12.0–15.0)
Hemoglobin: 6.6 g/dL — CL (ref 12.0–15.0)
Hemoglobin: 7.1 g/dL — ABNORMAL LOW (ref 12.0–15.0)
MCH: 28.8 pg (ref 26.0–34.0)
MCH: 29.7 pg (ref 26.0–34.0)
MCH: 29.7 pg (ref 26.0–34.0)
MCH: 29.7 pg (ref 26.0–34.0)
MCH: 29.7 pg (ref 26.0–34.0)
MCH: 29.8 pg (ref 26.0–34.0)
MCH: 30.3 pg (ref 26.0–34.0)
MCHC: 30 g/dL (ref 30.0–36.0)
MCHC: 30.9 g/dL (ref 30.0–36.0)
MCHC: 31.3 g/dL (ref 30.0–36.0)
MCHC: 32.2 g/dL (ref 30.0–36.0)
MCHC: 33.6 g/dL (ref 30.0–36.0)
MCHC: 34 g/dL (ref 30.0–36.0)
MCHC: 34.1 g/dL (ref 30.0–36.0)
MCV: 97 fL (ref 80.0–100.0)
MCV: 99.1 fL (ref 80.0–100.0)
MCV: 88.4 fL (ref 80.0–100.0)
MCV: 93.3 fL (ref 80.0–100.0)
MCV: 86.9 fL (ref 80.0–100.0)
MCV: 92.2 fL (ref 80.0–100.0)
MCV: 87.6 fL (ref 80.0–100.0)
NRBC: 0.2 % (ref 0.0–0.2)
NRBC: 0.5 % — AB (ref 0.0–0.2)
NRBC: 0 % (ref 0.0–0.2)
PLATELETS: 36 10*3/uL — AB (ref 150–400)
Platelets: 145 10*3/uL — ABNORMAL LOW (ref 150–400)
Platelets: 147 10*3/uL — ABNORMAL LOW (ref 150–400)
Platelets: 234 10*3/uL (ref 150–400)
Platelets: 25 10*3/uL — CL (ref 150–400)
Platelets: 34 10*3/uL — ABNORMAL LOW (ref 150–400)
Platelets: 582 10*3/uL — ABNORMAL HIGH (ref 150–400)
RBC: 2.08 MIL/uL — ABNORMAL LOW (ref 3.87–5.11)
RBC: 2.22 MIL/uL — AB (ref 3.87–5.11)
RBC: 2.34 MIL/uL — ABNORMAL LOW (ref 3.87–5.11)
RBC: 4.58 MIL/uL (ref 3.87–5.11)
RBC: 4.89 MIL/uL (ref 3.87–5.11)
RBC: 5.15 MIL/uL — ABNORMAL HIGH (ref 3.87–5.11)
RBC: 5.91 MIL/uL — ABNORMAL HIGH (ref 3.87–5.11)
RDW: 13.2 % (ref 11.5–15.5)
RDW: 13.9 % (ref 11.5–15.5)
RDW: 14 % (ref 11.5–15.5)
RDW: 14.1 % (ref 11.5–15.5)
RDW: 14.1 % (ref 11.5–15.5)
RDW: 14.3 % (ref 11.5–15.5)
RDW: 16.3 % — ABNORMAL HIGH (ref 11.5–15.5)
WBC: 17.8 10*3/uL — ABNORMAL HIGH (ref 4.0–10.5)
WBC: 30.3 10*3/uL — ABNORMAL HIGH (ref 4.0–10.5)
WBC: 37.3 10*3/uL — AB (ref 4.0–10.5)
WBC: 14.4 10*3/uL — AB (ref 4.0–10.5)
WBC: 15.1 10*3/uL — ABNORMAL HIGH (ref 4.0–10.5)
WBC: 3.6 10*3/uL — ABNORMAL LOW (ref 4.0–10.5)
WBC: 10.2 10*3/uL (ref 4.0–10.5)
nRBC: 0.2 % (ref 0.0–0.2)
nRBC: 0.1 % (ref 0.0–0.2)
nRBC: 1.7 % — ABNORMAL HIGH (ref 0.0–0.2)
nRBC: 0.7 % — ABNORMAL HIGH (ref 0.0–0.2)

## 2018-01-23 LAB — PREPARE RBC (CROSSMATCH)

## 2018-01-23 LAB — BASIC METABOLIC PANEL
ANION GAP: 16 — AB (ref 5–15)
Anion gap: 10 (ref 5–15)
Anion gap: 15 (ref 5–15)
BUN: 55 mg/dL — ABNORMAL HIGH (ref 6–20)
BUN: 53 mg/dL — ABNORMAL HIGH (ref 6–20)
BUN: 37 mg/dL — ABNORMAL HIGH (ref 6–20)
CHLORIDE: 118 mmol/L — AB (ref 98–111)
CO2: 15 mmol/L — ABNORMAL LOW (ref 22–32)
CO2: 10 mmol/L — ABNORMAL LOW (ref 22–32)
CO2: 13 mmol/L — ABNORMAL LOW (ref 22–32)
Calcium: 7.6 mg/dL — ABNORMAL LOW (ref 8.9–10.3)
Calcium: 5.8 mg/dL — CL (ref 8.9–10.3)
Calcium: 6.9 mg/dL — ABNORMAL LOW (ref 8.9–10.3)
Chloride: 122 mmol/L — ABNORMAL HIGH (ref 98–111)
Chloride: 123 mmol/L — ABNORMAL HIGH (ref 98–111)
Creatinine, Ser: 3.24 mg/dL — ABNORMAL HIGH (ref 0.44–1.00)
Creatinine, Ser: 2.67 mg/dL — ABNORMAL HIGH (ref 0.44–1.00)
Creatinine, Ser: 2.13 mg/dL — ABNORMAL HIGH (ref 0.44–1.00)
GFR calc Af Amer: 19 mL/min — ABNORMAL LOW (ref >60)
GFR calc Af Amer: 24 mL/min — ABNORMAL LOW (ref >60)
GFR calc Af Amer: 32 mL/min — ABNORMAL LOW (ref >60)
GFR calc non Af Amer: 16 mL/min — ABNORMAL LOW (ref >60)
GFR calc non Af Amer: 21 mL/min — ABNORMAL LOW (ref >60)
GFR, EST NON AFRICAN AMERICAN: 27 mL/min — AB (ref >60)
GLUCOSE: 225 mg/dL — AB (ref 70–99)
Glucose, Bld: 133 mg/dL — ABNORMAL HIGH (ref 70–99)
Glucose, Bld: 274 mg/dL — ABNORMAL HIGH (ref 70–99)
Potassium: 3.8 mmol/L (ref 3.5–5.1)
Potassium: 2.7 mmol/L — CL (ref 3.5–5.1)
Potassium: 3.3 mmol/L — ABNORMAL LOW (ref 3.5–5.1)
SODIUM: 147 mmol/L — AB (ref 135–145)
SODIUM: 143 mmol/L (ref 135–145)
Sodium: 152 mmol/L — ABNORMAL HIGH (ref 135–145)

## 2018-01-23 LAB — PROTIME-INR
INR: 1.35
INR: 1.67
INR: 1.98
PROTHROMBIN TIME: 16.5 s — AB (ref 11.4–15.2)
Prothrombin Time: 19.5 seconds — ABNORMAL HIGH (ref 11.4–15.2)
Prothrombin Time: 22.2 seconds — ABNORMAL HIGH (ref 11.4–15.2)

## 2018-01-23 LAB — POCT I-STAT 3, VENOUS BLOOD GAS (G3P V)
Acid-base deficit: 20 mmol/L — ABNORMAL HIGH (ref 0.0–2.0)
Bicarbonate: 7.3 mmol/L — ABNORMAL LOW (ref 20.0–28.0)
O2 SAT: 93 %
Patient temperature: 99.5
TCO2: 8 mmol/L — ABNORMAL LOW (ref 22–32)
pCO2, Ven: 24.3 mmHg — ABNORMAL LOW (ref 44.0–60.0)
pH, Ven: 7.087 — CL (ref 7.250–7.430)
pO2, Ven: 93 mmHg — ABNORMAL HIGH (ref 32.0–45.0)

## 2018-01-23 LAB — COMPREHENSIVE METABOLIC PANEL
ALT: 8 U/L (ref 0–44)
ALT: 10 U/L (ref 0–44)
AST: 18 U/L (ref 15–41)
AST: 28 U/L (ref 15–41)
Albumin: 1.6 g/dL — ABNORMAL LOW (ref 3.5–5.0)
Albumin: 1.3 g/dL — ABNORMAL LOW (ref 3.5–5.0)
Alkaline Phosphatase: 29 U/L — ABNORMAL LOW (ref 38–126)
Alkaline Phosphatase: 37 U/L — ABNORMAL LOW (ref 38–126)
Anion gap: 13 (ref 5–15)
BUN: 51 mg/dL — AB (ref 6–20)
BUN: 52 mg/dL — ABNORMAL HIGH (ref 6–20)
CO2: 13 mmol/L — ABNORMAL LOW (ref 22–32)
CO2: <7 mmol/L — ABNORMAL LOW (ref 22–32)
Calcium: 6.4 mg/dL — CL (ref 8.9–10.3)
Calcium: 7.4 mg/dL — ABNORMAL LOW (ref 8.9–10.3)
Chloride: 119 mmol/L — ABNORMAL HIGH (ref 98–111)
Chloride: 128 mmol/L — ABNORMAL HIGH (ref 98–111)
Creatinine, Ser: 2.97 mg/dL — ABNORMAL HIGH (ref 0.44–1.00)
Creatinine, Ser: 2.98 mg/dL — ABNORMAL HIGH (ref 0.44–1.00)
GFR calc Af Amer: 21 mL/min — ABNORMAL LOW (ref >60)
GFR calc Af Amer: 21 mL/min — ABNORMAL LOW (ref >60)
GFR calc non Af Amer: 18 mL/min — ABNORMAL LOW (ref >60)
GFR calc non Af Amer: 18 mL/min — ABNORMAL LOW (ref >60)
Glucose, Bld: 260 mg/dL — ABNORMAL HIGH (ref 70–99)
Glucose, Bld: 157 mg/dL — ABNORMAL HIGH (ref 70–99)
Potassium: 3.4 mmol/L — ABNORMAL LOW (ref 3.5–5.1)
Potassium: 4.5 mmol/L (ref 3.5–5.1)
Sodium: 145 mmol/L (ref 135–145)
Sodium: 150 mmol/L — ABNORMAL HIGH (ref 135–145)
Total Bilirubin: 0.4 mg/dL (ref 0.3–1.2)
Total Bilirubin: 0.6 mg/dL (ref 0.3–1.2)
Total Protein: 3.6 g/dL — ABNORMAL LOW (ref 6.5–8.1)
Total Protein: <3 g/dL — ABNORMAL LOW (ref 6.5–8.1)

## 2018-01-23 LAB — GLUCOSE, CAPILLARY
GLUCOSE-CAPILLARY: 203 mg/dL — AB (ref 70–99)
Glucose-Capillary: 217 mg/dL — ABNORMAL HIGH (ref 70–99)
Glucose-Capillary: 248 mg/dL — ABNORMAL HIGH (ref 70–99)
Glucose-Capillary: 99 mg/dL (ref 70–99)

## 2018-01-23 LAB — LACTIC ACID, PLASMA
Lactic Acid, Venous: 4.6 mmol/L (ref 0.5–1.9)
Lactic Acid, Venous: 11 mmol/L (ref 0.5–1.9)

## 2018-01-23 LAB — HEMOGLOBIN AND HEMATOCRIT, BLOOD
HCT: 31.6 % — ABNORMAL LOW (ref 36.0–46.0)
Hemoglobin: 10 g/dL — ABNORMAL LOW (ref 12.0–15.0)

## 2018-01-23 LAB — BRAIN NATRIURETIC PEPTIDE: B Natriuretic Peptide: 337.2 pg/mL — ABNORMAL HIGH (ref 0.0–100.0)

## 2018-01-23 LAB — TROPONIN I
Troponin I: 0.05 ng/mL (ref <0.03)
Troponin I: 0.3 ng/mL (ref <0.03)
Troponin I: 0.28 ng/mL (ref <0.03)

## 2018-01-23 LAB — PROCALCITONIN: Procalcitonin: 0.24 ng/mL

## 2018-01-23 LAB — MRSA PCR SCREENING: MRSA by PCR: NEGATIVE

## 2018-01-23 LAB — TRIGLYCERIDES: Triglycerides: 49 mg/dL (ref <150)

## 2018-01-23 LAB — APTT: aPTT: 31 seconds (ref 24–36)

## 2018-01-23 LAB — FIBRINOGEN: Fibrinogen: 197 mg/dL — ABNORMAL LOW (ref 210–475)

## 2018-01-23 SURGERY — IR WITH ANESTHESIA
Anesthesia: General

## 2018-01-23 SURGERY — EGD (ESOPHAGOGASTRODUODENOSCOPY)
Anesthesia: Monitor Anesthesia Care

## 2018-01-23 MED ORDER — SODIUM CHLORIDE 0.9 % FOR CRRT
INTRAVENOUS_CENTRAL | Status: DC | PRN
  Filled 2018-01-23: qty 1000

## 2018-01-23 MED ORDER — ALBUMIN HUMAN 25 % IV SOLN
12.5000 g | Freq: Once | INTRAVENOUS | Status: AC
  Administered 2018-01-23: 12.5 g via INTRAVENOUS

## 2018-01-23 MED ORDER — CALCIUM GLUCONATE-NACL 2-0.675 GM/100ML-% IV SOLN
2.0000 g | Freq: Once | INTRAVENOUS | Status: AC
  Administered 2018-01-23: 2000 mg via INTRAVENOUS
  Filled 2018-01-23: qty 100

## 2018-01-23 MED ORDER — FENTANYL CITRATE (PF) 100 MCG/2ML IJ SOLN
INTRAMUSCULAR | Status: DC | PRN
  Administered 2018-01-23: 100 ug via INTRAVENOUS

## 2018-01-23 MED ORDER — SODIUM BICARBONATE 8.4 % IV SOLN
150.0000 meq | Freq: Once | INTRAVENOUS | Status: AC
  Administered 2018-01-23: 150 meq via INTRAVENOUS

## 2018-01-23 MED ORDER — IOPAMIDOL (ISOVUE-300) INJECTION 61%
INTRAVENOUS | Status: AC
  Filled 2018-01-23: qty 100

## 2018-01-23 MED ORDER — ROCURONIUM BROMIDE 50 MG/5ML IV SOLN
50.0000 mg | Freq: Once | INTRAVENOUS | Status: AC
  Administered 2018-01-23: 50 mg via INTRAVENOUS
  Filled 2018-01-23: qty 5

## 2018-01-23 MED ORDER — SODIUM CHLORIDE 0.9% IV SOLUTION: Freq: Once | INTRAVENOUS | Status: DC

## 2018-01-23 MED ORDER — LIDOCAINE HCL 1 % IJ SOLN
INTRAMUSCULAR | Status: AC
  Filled 2018-01-23: qty 20

## 2018-01-23 MED ORDER — SODIUM CHLORIDE 0.9 % IV SOLN
INTRAVENOUS | Status: DC
  Administered 2018-01-23: 11:00:00 via INTRAVENOUS

## 2018-01-23 MED ORDER — SODIUM BICARBONATE 8.4 % IV SOLN
50.0000 meq | Freq: Once | INTRAVENOUS | Status: AC
  Administered 2018-01-23: 50 meq via INTRAVENOUS

## 2018-01-23 MED ORDER — NOREPINEPHRINE 16 MG/250ML-% IV SOLN
0.0000 ug/min | INTRAVENOUS | Status: DC
  Administered 2018-01-24: 40 ug/min via INTRAVENOUS
  Filled 2018-01-23: qty 250

## 2018-01-23 MED ORDER — PRISMASOL BGK 4/2.5 32-4-2.5 MEQ/L IV SOLN
INTRAVENOUS | Status: DC
  Administered 2018-01-23 – 2018-01-24 (×4): via INTRAVENOUS_CENTRAL
  Filled 2018-01-23 (×9): qty 5000

## 2018-01-23 MED ORDER — ROCURONIUM BROMIDE 50 MG/5ML IV SOLN
25.0000 mg | Freq: Once | INTRAVENOUS | Status: DC
  Filled 2018-01-23: qty 2.5

## 2018-01-23 MED ORDER — SODIUM CHLORIDE 0.9 % IV SOLN
8.0000 mg/h | INTRAVENOUS | Status: DC
  Administered 2018-01-23 (×2): 8 mg/h via INTRAVENOUS
  Filled 2018-01-23 (×5): qty 80

## 2018-01-23 MED ORDER — SODIUM CHLORIDE 0.9 % IV SOLN
1.0000 g | Freq: Once | INTRAVENOUS | Status: AC
  Administered 2018-01-23: 1 g via INTRAVENOUS
  Filled 2018-01-23: qty 1

## 2018-01-23 MED ORDER — FENTANYL CITRATE (PF) 100 MCG/2ML IJ SOLN: 50.0000 ug | Freq: Once | INTRAMUSCULAR | Status: DC

## 2018-01-23 MED ORDER — ORAL CARE MOUTH RINSE
15.0000 mL | OROMUCOSAL | Status: DC
  Administered 2018-01-23 – 2018-01-24 (×4): 15 mL via OROMUCOSAL

## 2018-01-23 MED ORDER — FENTANYL CITRATE (PF) 100 MCG/2ML IJ SOLN
INTRAMUSCULAR | Status: AC
  Filled 2018-01-23: qty 2

## 2018-01-23 MED ORDER — CHLORHEXIDINE GLUCONATE 0.12% ORAL RINSE (MEDLINE KIT)
15.0000 mL | Freq: Two times a day (BID) | OROMUCOSAL | Status: DC
  Administered 2018-01-23 (×2): 15 mL via OROMUCOSAL

## 2018-01-23 MED ORDER — PROPOFOL 1000 MG/100ML IV EMUL: 0.0000 ug/kg/min | INTRAVENOUS | Status: DC

## 2018-01-23 MED ORDER — SODIUM CHLORIDE 0.9 % IV SOLN
80.0000 mg | Freq: Once | INTRAVENOUS | Status: AC
  Administered 2018-01-23: 80 mg via INTRAVENOUS
  Filled 2018-01-23: qty 80

## 2018-01-23 MED ORDER — METRONIDAZOLE IN NACL 5-0.79 MG/ML-% IV SOLN
500.0000 mg | Freq: Three times a day (TID) | INTRAVENOUS | Status: DC
  Administered 2018-01-23 – 2018-01-24 (×3): 500 mg via INTRAVENOUS
  Filled 2018-01-23 (×4): qty 100

## 2018-01-23 MED ORDER — FENTANYL BOLUS VIA INFUSION
50.0000 ug | INTRAVENOUS | Status: DC | PRN
  Filled 2018-01-23: qty 50

## 2018-01-23 MED ORDER — SODIUM CHLORIDE 0.9% FLUSH
10.0000 mL | Freq: Two times a day (BID) | INTRAVENOUS | Status: DC
  Administered 2018-01-23: 10 mL

## 2018-01-23 MED ORDER — VANCOMYCIN HCL IN DEXTROSE 1-5 GM/200ML-% IV SOLN
1000.0000 mg | Freq: Once | INTRAVENOUS | Status: DC
  Filled 2018-01-23: qty 200

## 2018-01-23 MED ORDER — EPINEPHRINE PF 1 MG/10ML IJ SOSY
PREFILLED_SYRINGE | INTRAMUSCULAR | Status: AC
  Filled 2018-01-23: qty 40

## 2018-01-23 MED ORDER — SODIUM BICARBONATE 8.4 % IV SOLN: 150.0000 meq | Freq: Once | INTRAVENOUS | Status: AC

## 2018-01-23 MED ORDER — SODIUM CHLORIDE 0.9 % IV SOLN
INTRAVENOUS | Status: DC | PRN
  Administered 2018-01-23 (×2): via INTRAVENOUS

## 2018-01-23 MED ORDER — VANCOMYCIN VARIABLE DOSE PER UNSTABLE RENAL FUNCTION (PHARMACIST DOSING): Status: DC

## 2018-01-23 MED ORDER — SODIUM CHLORIDE 0.9 % IV BOLUS: 500.0000 mL | Freq: Once | INTRAVENOUS | Status: DC

## 2018-01-23 MED ORDER — FUROSEMIDE 10 MG/ML IJ SOLN
40.0000 mg | Freq: Once | INTRAMUSCULAR | Status: AC
  Administered 2018-01-23: 40 mg via INTRAVENOUS

## 2018-01-23 MED ORDER — PANTOPRAZOLE SODIUM 40 MG IV SOLR: 40.0000 mg | Freq: Two times a day (BID) | INTRAVENOUS | Status: DC

## 2018-01-23 MED ORDER — VASOPRESSIN 20 UNIT/ML IV SOLN
0.0400 [IU]/min | INTRAVENOUS | Status: DC
  Administered 2018-01-23: 0.04 [IU]/min via INTRAVENOUS
  Filled 2018-01-23: qty 2

## 2018-01-23 MED ORDER — SODIUM BICARBONATE 8.4 % IV SOLN
50.0000 meq | Freq: Once | INTRAVENOUS | Status: AC
  Administered 2018-01-23: 100 meq via INTRAVENOUS

## 2018-01-23 MED ORDER — POTASSIUM CHLORIDE 10 MEQ/50ML IV SOLN
10.0000 meq | INTRAVENOUS | Status: AC
  Administered 2018-01-23 (×4): 10 meq via INTRAVENOUS
  Filled 2018-01-23 (×4): qty 50

## 2018-01-23 MED ORDER — IOPAMIDOL (ISOVUE-300) INJECTION 61%
INTRAVENOUS | Status: AC
  Administered 2018-01-23: 30 mL
  Filled 2018-01-23: qty 200

## 2018-01-23 MED ORDER — SODIUM CHLORIDE 0.9 % IV BOLUS
500.0000 mL | Freq: Once | INTRAVENOUS | Status: AC
  Administered 2018-01-23: 500 mL via INTRAVENOUS

## 2018-01-23 MED ORDER — ALTEPLASE 2 MG IJ SOLR
2.0000 mg | Freq: Once | INTRAMUSCULAR | Status: DC | PRN
  Filled 2018-01-23: qty 2

## 2018-01-23 MED ORDER — CALCIUM CHLORIDE 10 % IV SOLN
INTRAVENOUS | Status: DC | PRN
  Administered 2018-01-23: 1 g via INTRAVENOUS

## 2018-01-23 MED ORDER — MIDAZOLAM HCL 2 MG/2ML IJ SOLN
INTRAMUSCULAR | Status: AC
  Filled 2018-01-23: qty 2

## 2018-01-23 MED ORDER — MIDAZOLAM HCL 2 MG/2ML IJ SOLN
INTRAMUSCULAR | Status: AC
  Filled 2018-01-23: qty 4

## 2018-01-23 MED ORDER — SODIUM CHLORIDE 0.9 % IV SOLN
500.0000 mg | Freq: Two times a day (BID) | INTRAVENOUS | Status: AC
  Administered 2018-01-23: 500 mg via INTRAVENOUS
  Filled 2018-01-23 (×2): qty 0.5

## 2018-01-23 MED ORDER — PHENYLEPHRINE HCL-NACL 10-0.9 MG/250ML-% IV SOLN
0.0000 ug/min | INTRAVENOUS | Status: DC
  Administered 2018-01-23: 300 ug/min via INTRAVENOUS
  Filled 2018-01-23: qty 250

## 2018-01-23 MED ORDER — VANCOMYCIN HCL IN DEXTROSE 1-5 GM/200ML-% IV SOLN
1000.0000 mg | Freq: Once | INTRAVENOUS | Status: AC
  Administered 2018-01-23: 1000 mg via INTRAVENOUS
  Filled 2018-01-23 (×2): qty 200

## 2018-01-23 MED ORDER — ROCURONIUM BROMIDE 50 MG/5ML IV SOSY
PREFILLED_SYRINGE | INTRAVENOUS | Status: DC | PRN
  Administered 2018-01-23 (×2): 50 mg via INTRAVENOUS

## 2018-01-23 MED ORDER — EPINEPHRINE PF 1 MG/ML IJ SOLN
1.0000 mg | Freq: Once | INTRAMUSCULAR | Status: DC
  Filled 2018-01-23: qty 1

## 2018-01-23 MED ORDER — PHENYLEPHRINE HCL-NACL 40-0.9 MG/250ML-% IV SOLN
0.0000 ug/min | INTRAVENOUS | Status: DC
  Administered 2018-01-23: 150 ug/min via INTRAVENOUS
  Administered 2018-01-23: 300 ug/min via INTRAVENOUS
  Administered 2018-01-23: 400 ug/min via INTRAVENOUS
  Administered 2018-01-23: 150 ug/min via INTRAVENOUS
  Filled 2018-01-23 (×6): qty 250

## 2018-01-23 MED ORDER — STERILE WATER FOR INJECTION IV SOLN
INTRAVENOUS | Status: DC
  Administered 2018-01-23 – 2018-01-24 (×3): via INTRAVENOUS_CENTRAL
  Filled 2018-01-23 (×7): qty 150

## 2018-01-23 MED ORDER — FENTANYL 2500MCG IN NS 250ML (10MCG/ML) PREMIX INFUSION
25.0000 ug/h | INTRAVENOUS | Status: DC
  Administered 2018-01-23: 100 ug/h via INTRAVENOUS
  Filled 2018-01-23: qty 250

## 2018-01-23 MED ORDER — SODIUM BICARBONATE 8.4 % IV SOLN
INTRAVENOUS | Status: DC
  Administered 2018-01-23: 12:00:00 via INTRAVENOUS
  Filled 2018-01-23 (×4): qty 150

## 2018-01-23 MED ORDER — MORPHINE SULFATE (PF) 2 MG/ML IV SOLN: 2.0000 mg | INTRAVENOUS | Status: DC | PRN

## 2018-01-23 MED ORDER — CALCIUM GLUCONATE-NACL 1-0.675 GM/50ML-% IV SOLN
1.0000 g | Freq: Once | INTRAVENOUS | Status: AC
  Administered 2018-01-23: 1000 mg via INTRAVENOUS
  Filled 2018-01-23: qty 50

## 2018-01-23 MED ORDER — MIDAZOLAM HCL (PF) 5 MG/ML IJ SOLN
INTRAMUSCULAR | Status: AC
  Filled 2018-01-23: qty 1

## 2018-01-23 MED ORDER — MIDAZOLAM HCL 5 MG/5ML IJ SOLN
INTRAMUSCULAR | Status: DC | PRN
  Administered 2018-01-23 (×2): 2 mg via INTRAVENOUS

## 2018-01-23 MED ORDER — SODIUM BICARBONATE 8.4 % IV SOLN
INTRAVENOUS | Status: AC
  Administered 2018-01-23: 150 meq via INTRAVENOUS
  Filled 2018-01-23: qty 100

## 2018-01-23 MED ORDER — CHLORHEXIDINE GLUCONATE CLOTH 2 % EX PADS
6.0000 | MEDICATED_PAD | Freq: Every day | CUTANEOUS | Status: DC
  Administered 2018-01-24: 6 via TOPICAL

## 2018-01-23 MED ORDER — SODIUM CHLORIDE 0.9 % IV SOLN
1.0000 g | Freq: Two times a day (BID) | INTRAVENOUS | Status: DC
  Filled 2018-01-23: qty 1

## 2018-01-23 MED ORDER — NOREPINEPHRINE-SODIUM CHLORIDE 4-0.9 MG/250ML-% IV SOLN
0.0000 ug/min | INTRAVENOUS | Status: DC
  Administered 2018-01-23: 5 ug/min via INTRAVENOUS
  Administered 2018-01-23: 38 ug/min via INTRAVENOUS
  Filled 2018-01-23 (×3): qty 250
  Filled 2018-01-23: qty 500

## 2018-01-23 MED ORDER — SODIUM CHLORIDE 0.9% FLUSH: 10.0000 mL | INTRAVENOUS | Status: DC | PRN

## 2018-01-23 MED ORDER — HEPARIN SODIUM (PORCINE) 1000 UNIT/ML DIALYSIS
1000.0000 [IU] | INTRAMUSCULAR | Status: DC | PRN
  Administered 2018-01-24: 2400 [IU] via INTRAVENOUS_CENTRAL
  Filled 2018-01-23 (×2): qty 6

## 2018-01-23 MED ORDER — SODIUM CHLORIDE 0.9 % IV SOLN
1.0000 g | INTRAVENOUS | Status: DC
  Filled 2018-01-23: qty 10

## 2018-01-23 MED ORDER — STERILE WATER FOR INJECTION IV SOLN
INTRAVENOUS | Status: DC
  Administered 2018-01-23 – 2018-01-24 (×3): via INTRAVENOUS_CENTRAL
  Filled 2018-01-23 (×5): qty 150

## 2018-01-23 MED FILL — Medication: Qty: 1 | Status: AC

## 2018-01-23 NOTE — Progress Notes (Addendum)
NAME:  Katherine Kramer, MRN:  [id number], DOB:  Nov 15, 1970, LOS: 4 ADMISSION DATE:  01/25/2018, CONSULTATION DATE:  02/04/2018 REFERRING MD:  Dr. Jenean Lindau, CHIEF COMPLAINT:  BRBPR, Hypovolemic shock   Brief History   Katherine Kramer is a 48 year old woman with cervical cancer status post chemoradiation & Hartmann procedure, radiation proctitis, bilateral nephrosis status post nephrostomy tubes and CKD stage IV who initially presented on 1/8 with lethargy and altered mental status thought to be secondary to opioid intoxication.  She was also found to be anemic with positive FOBT.  Her hospital course has been complicated with hypotension with systolic blood pressure in the 50s, anemia with Hbg of 6. She received 3U PRBC and 2U FFP with repeat Hgb of 7.1  PCCM was consulted on 01/30/202019 and she was subsequently started on phenylephrine and intubated due to inability to protect her airway.  Past Medical History  Cervical cancer s/p -Pelvic lymphadenopathy and oophoropexy -Chemoradiation complicated with radiation proctitis -Bilateral ureteral obstruction status post bilateral nephrostomy tubes -Sigmoid perforation status post Hartman's procedure April 2017 -Colostomy dilatation for ostomy, September 2017 -Colostomy revision, January 2018 -Hartman's closure with diverting loop ileostomy, June 2018 Radiation proctitis  Bilateral nephrosis s/p nephrostomy tubes  CKD IV  Significant Hospital Events   1/8> Admission  1/12>PCCM consult  1/12 >Intubated  1/12>PEA arrest. ROSC after 9 mins CPR  Consults:  PCCM  Procedures:  1/12>Intubation   Significant Diagnostic Tests:  +FOBT  Micro Data:  1/8 Blood Cx> NGTD  Antimicrobials:  1/12>Meropenem  1/12>Flagyl  1/12 Vancomycin > 1/12  Interim history/subjective:  Overnight: Hypotensive 90s/70s, Hypocalcemia, Troponemia, Hgb 7.1  Today, Katherine Kramer was interactive however several minutes after assessment, she was found to be in PEA  arrest. She underwent PCR for 9 mins and attained ROSC.   Objective   Blood pressure 114/86, pulse (!) (P) 103, temperature (!) (P) 97 F (36.1 C), temperature source (P) Oral, resp. rate (!) 25, height 5\' 2"  (1.575 m), weight 46.6 kg, SpO2 (P) 100 %. CVP:  [11 mmHg-14 mmHg] 11 mmHg  Vent Mode: PRVC FiO2 (%):  [70 %-100 %] 70 % Set Rate:  [14 bmp-24 bmp] 24 bmp Vt Set:  [460 mL] 460 mL PEEP:  [5 cmH20] 5 cmH20 Plateau Pressure:  [13 cmH20] 13 cmH20   Intake/Output Summary (Last 24 hours) at 01/22/2018 0102 Last data filed at 02/04/2018 7253 Gross per 24 hour  Intake 594.72 ml  Output 2150 ml  Net -1555.28 ml   Filed Weights   01/20/18 0801 01/21/18 0609  Weight: 52 kg 46.6 kg    Examination: General: Intubated, unresponsive HENT: ETT in place, NCAT Lungs: CTABL, no wheezes or crackles Cardiovascular: RRR, no MGR Abdomen: Soft, Non distended, NTTP, old midline scar, pooled blood on bed  Extremities: No edema  Neuro: unresponsive  GU: Foley catheter in place  Resolved Hospital Problem list     Assessment & Plan:  Katherine Kramer is a 48 year old woman with cervical cancer status post chemoradiation & Hartmann procedure, radiation proctitis, bilateral nephrosis status post nephrostomy tubes and CKD stage IV transferred admitted to the ICU with shock   #Hemorrhagic Shock suspecting lower GI bleed (Radiation Proctitis vs Colitis) -She was found to have brisk pooled blood on her bedsheet this am. Several minutes after evaluation, she was found to be in PEA arrest. She attained ROSC after 9 minutes of CPR, epinephrine  -Continue levophed, Neo -Monitor CVP, SvO2 -Follow up CT abdomen, Tagged red blood scan  -Appreciate GI  reccs  #Acute blood loss anemia  -Hbg this am 6.6<< 7.1<<6 -Transfuse 2U PRBC -Transfuse if Hgb<8 -Follow up CBC q4  #Inability to protect airway  -ETT -Daily WUA/SBT -Continue fentanyl, morphine   #Acute Pyelonephritis #Septic shock still possible -Hx  cervical cancer with bilateral nephrosis s/p nephrostomy tubes  -Lactic acidosis, leukocytosis  -Blood Cx NGTD -s/p vancomycin  -Continue meropenem and flagyl   #Acute toxic encehalopathy - resolved  -There was concern for opioid and benzo intoxication )Takes Xanax, Roxicodone, Duragesic, Fentanyl patch) -Intubated  -Follow up ammonia   #Hypocalcemia -resolved  -Continue to monitor   #Hypokalemia - resolved  -Continue to monitor   #Demand ischemia  -Troponin 0.05 -Trend troponin   #CKD Stage IV #Non anion ghap metabolic acidosis  -sCr. 2.98 (Admission sCr 5.51) -Lactic acidosis secondary to cardiac arrest  -Follow up BMP  Best practice:  Diet: NPO Pain/Anxiety/Delirium protocol (if indicated): Yes VAP protocol (if indicated): yes DVT prophylaxis: SCDs GI prophylaxis: Protonix ggt  Glucose control: None Mobility: Bedrest Code Status: Full  Family Communication: None at bedside  Disposition: Remain in ICU   Labs   CBC: Recent Labs  Lab 02/01/2018 2000 01/20/18 0329 01/20/18 1127 01/21/18 0745 01/22/18 0417 01/22/18 1549 02/01/2018 0042 01/12/2018 0245  WBC 9.9 7.9 9.0 8.6 7.4 9.5 14.4* 17.8*  NEUTROABS 7.2 5.6 6.7  --   --   --   --   --   HGB 6.3* 6.3* 9.9* 8.9* 7.4* 7.6* 6.0* 7.1*  HCT 22.1* 22.1* 33.0* 29.3* 23.3* 23.9* 19.4* 22.7*  MCV 98.7 97.8 95.9 98.3 93.6 90.9 93.3 97.0  PLT 847* 623* 774* 742* 616* 625* 582* 741    Basic Metabolic Panel: Recent Labs  Lab 01/21/18 0745 01/22/18 0417 01/22/18 1549 01/12/2018 0042 02/08/2018 0245  NA 144 143 144 143 145  K 4.7 3.9 3.6 3.8 3.4*  CL 119* 118* 115* 118* 119*  CO2 12* 15* 17* 15* 13*  GLUCOSE 86 101* 105* 133* 260*  BUN 57* 58* 56* 55* 51*  CREATININE 4.02* 3.78* 3.58* 3.24* 2.97*  CALCIUM 8.6* 8.2* 8.3* 7.6* 6.4*   GFR: Estimated Creatinine Clearance: 17.6 mL/min (A) (by C-G formula based on SCr of 2.97 mg/dL (H)). Recent Labs  Lab 01/29/2018 2011 01/12/2018 2204 01/20/18 0329 01/20/18 0749   01/22/18 0417 01/22/18 1549 01/22/2018 0042 01/17/2018 0245  PROCALCITON  --   --   --  1.25  --   --   --   --  0.24  WBC  --   --  7.9  --    < > 7.4 9.5 14.4* 17.8*  LATICACIDVEN 0.45* 0.75 0.4*  --   --   --   --   --  4.6*   < > = values in this interval not displayed.    Liver Function Tests: Recent Labs  Lab 01/14/2018 2000 01/20/18 0329 02/01/2018 0245  AST 20 16 18   ALT 9 6 8   ALKPHOS 67 51 29*  BILITOT 0.6 0.9 0.4  PROT 7.5 5.8* 3.6*  ALBUMIN 2.5* 1.9* 1.6*   No results for input(s): LIPASE, AMYLASE in the last 168 hours. Recent Labs  Lab 01/25/2018 2000  AMMONIA 15    ABG    Component Value Date/Time   PHART 7.325 (L) 01/15/2018 0623   PCO2ART 21.1 (L) 02/10/2018 0623   PO2ART 309.0 (H) 01/14/2018 0623   HCO3 11.1 (L) 02/08/2018 0623   TCO2 12 (L) 01/21/2018 0623   ACIDBASEDEF 14.0 (H) 02/07/2018 6384  O2SAT 100.0 02/06/2018 0623     Coagulation Profile: Recent Labs  Lab 01/12/2018 0042  INR 1.35    Cardiac Enzymes: Recent Labs  Lab 01/20/18 0749 01/30/2018 0245  CKTOTAL 118  --   TROPONINI <0.03 0.05*    HbA1C: No results found for: HGBA1C  CBG: Recent Labs  Lab 01/22/18 0148 01/22/18 0826 02/05/2018 0023 01/13/2018 0240 02/06/2018 0402  GLUCAP 94 112* 99 248* 203*

## 2018-01-23 NOTE — Procedures (Signed)
Arterial Catheter Insertion Procedure Note Katherine Kramer [id number] Oct 01, 1970  Procedure: Insertion of Arterial Catheter  Indications: Blood pressure monitoring and Frequent blood sampling  Procedure Details Consent: Unable to obtain consent because of emergent medical necessity. Time Out: Verified patient identification, verified procedure, site/side was marked, verified correct patient position, special equipment/implants available, medications/allergies/relevent history reviewed, required imaging and test results available.  Performed  Maximum sterile technique was used including antiseptics, cap, gloves, gown, hand hygiene, mask and sheet. Skin prep: Chlorhexidine; local anesthetic administered 22 gauge catheter was inserted into left radial artery using the Seldinger technique. ULTRASOUND GUIDANCE USED: NO Evaluation Blood flow good; BP tracing good. Complications: No apparent complications.   [name] 02/06/2018

## 2018-01-23 NOTE — Code Documentation (Addendum)
  Patient Name: Katherine Kramer   MRN: [id number]   Date of Birth/ Sex: August 12, 1970 , female      Admission Date: 01/14/2018  Attending Provider: Renee Pain, MD  Primary Diagnosis: Hemorrhagic shock [facility])   Indication: Pt was in her usual state of health until this AM, when she was noted to be in PEA. Code blue was subsequently called. At the time of arrival on scene, ACLS was started    Technical Description:  - CPR performance duration:  6 minutes  - Was defibrillation or cardioversion used? No   - Was external pacer placed? Yes  - Was patient intubated pre/post CPR? Yes intubated pre CPR    Medications Administered: Y = Yes; Blank = No Amiodarone    Atropine    Calcium    Epinephrine  Y  Lidocaine    Magnesium    Norepinephrine  Y   Phenylephrine    Sodium bicarbonate    Vasopressin     Post CPR evaluation:  - Final Status - Was patient successfully resuscitated ? Yes - What is current rhythm? Sinus tachycardia  - What is current hemodynamic status? Hemodynamically unstable, started on levophed   Miscellaneous Information:  - Labs sent, including: Lactic acid, arterial blood gas, BMP, CBC   - Primary team notified?  Yes  - Family Notified? Yes  - Additional notes/ transfer status: Patient in in the MICU      Ledell Noss, MD  01/12/2018, 8:54 AM   I supervised ACLS for PEA arrest in the setting of lower GI bleed, downtime about 6 minutes requiring epi x2 to achieve ROSC .  Started on levophed 20 mics for hypotension and emergently transfused with 2 units PRBC Sister updated in detail.  Leanna Sato Elsworth Soho MD

## 2018-01-23 NOTE — Op Note (Signed)
[facility] Patient Name: Katherine Kramer Procedure Date : 02/09/2018 MRN: [id number] Attending MD: Lear Ng , MD Date of Birth: Oct 01, 1970 CSN: [id number] Age: 48 Admit Type: Inpatient Procedure:                Flexible Sigmoidoscopy Indications:              Hematochezia Providers:                Lear Ng, MD, Zenon Mayo, RN,                            William Dalton, Technician Referring MD:             CCM team Medicines:                per CCM team Complications:            No immediate complications. Estimated Blood Loss:     Estimated blood loss: 10 mL. Procedure:                Pre-Anesthesia Assessment:                           - Prior to the procedure, a History and Physical                            was performed, and patient medications and                            allergies were reviewed. The patient's tolerance of                            previous anesthesia was also reviewed. The risks                            and benefits of the procedure and the sedation                            options and risks were discussed with the patient.                            All questions were answered, and informed consent                            was obtained. Prior Anticoagulants: The patient has                            taken no previous anticoagulant or antiplatelet                            agents. ASA Grade Assessment: V - A moribund                            patient who is not expected to survive without the  operation. After reviewing the risks and benefits,                            the patient was deemed in satisfactory condition to                            undergo the procedure.                           After obtaining informed consent, the scope was                            passed under direct vision. The GIF-H190 ([id number])                            Olympus Adult EGD was introduced  through the anus                            and advanced to the the rectosigmoid junction. The                            flexible sigmoidoscopy was performed with                            difficulty due to excessive bleeding. The patient                            tolerated the procedure. The quality of the bowel                            preparation was an unprepped procedure. Scope In: 1:53:43 PM Scope Out: 2:00:34 PM Total Procedure Duration: 0 hours 6 minutes 51 seconds  Findings:      A diffuse area of severely congested, erythematous, eroded, hemorrhagic,       inflamed, ulcerated, vascular-pattern-decreased and thickened folds of       the mucosa was found in the rectum and in the recto-sigmoid colon.      Large amount of severely edematous rectal mucosa and distal sigmoid       mucosa with adherent clots and active bleeding seen about 15 cm from the       anus. Unable to pinpoint one area of bleeding and it appeared that an       entire segment of mucosa was actively bleeding. Large amount of bright       red blood seen in the examined colon. Endoscope was not advanced more       than a few cm proximal to the area of mucosa that looked like the       anastomosis. Impression:               - Congested, erythematous, eroded, hemorrhagic,                            inflamed, ulcerated, vascular-pattern-decreased and  thickened folds of the mucosa in the rectum and in                            the recto-sigmoid colon.                           - No specimens collected.                           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 Recommendation:           - Bleeding not amenable to endoscopic cautery.                            Would recommend surgical evaluation and                            interventional radiology evaluation to see if                             bleeding can be stopped. Continue aggressive volume                            resuscitation. Procedure Code(s):        --- Professional ---                           [phone], 29, Sigmoidoscopy, flexible; diagnostic,                            including collection of specimen(s) by brushing or                            washing, when performed (separate procedure) Diagnosis Code(s):        --- Professional ---                           K92.1, Melena (includes Hematochezia)                           K63.3, Ulcer of intestine                           K62.6, Ulcer of anus and rectum                           K52.9, Noninfective gastroenteritis and colitis,                            unspecified                           K62.5, Hemorrhage of anus and rectum  K92.2, Gastrointestinal hemorrhage, unspecified                           K62.89, Other specified diseases of anus and rectum                           K63.89, Other specified diseases of intestine CPT copyright 2018 [employer]. All rights reserved. The codes documented in this report are preliminary and upon coder review may  be revised to meet current compliance requirements. Lear Ng, MD 01/20/2018 3:12:28 PM This report has been signed electronically. Number of Addenda: 0

## 2018-01-23 NOTE — Progress Notes (Deleted)
  Patient Name: Katherine Kramer   MRN: [id number]   Date of Birth/ Sex: 1970/10/17 , female      Admission Date: 01/12/2018  Attending Provider: Renee Pain, MD  Primary Diagnosis: Hemorrhagic shock [facility])   Indication: Pt was in her usual state of health until this AM, when she was noted to be PEA arrest. Code blue was subsequently called. At the time of arrival on scene, ACLS protocol was underway.   Technical Description:  - CPR performance duration:  9 minutes  - Was defibrillation or cardioversion used? No   - Was external pacer placed? No  - Was patient intubated pre/post CPR? Yes   Medications Administered: Y = Yes; Blank = No Amiodarone    Atropine    Calcium    Epinephrine  2.5  Lidocaine    Magnesium    Norepinephrine    Phenylephrine    Sodium bicarbonate    Vasopressin     Post CPR evaluation:  - Final Status - Was patient successfully resuscitated ? Yes - What is current rhythm? Sinus tachycardia  - What is current hemodynamic status? BP 90s/70s  Miscellaneous Information:  - Labs sent, including: Yes  - Primary team notified?  Yes  - Family Notified? Yes  - Additional notes/ transfer status: None, GI consulted      Jean Rosenthal, MD  02/08/2018, 9:00 AM

## 2018-01-23 NOTE — Op Note (Signed)
[facility] Patient Name: Katherine Kramer Procedure Date : 02/03/2018 MRN: [id number] Attending MD: Lear Ng , MD Date of Birth: 1970-05-29 CSN: [id number] Age: 48 Admit Type: Inpatient Procedure:                Upper GI endoscopy Indications:              Active gastrointestinal bleeding, Hematochezia Providers:                Lear Ng, MD, Zenon Mayo, RN,                            William Dalton, Technician Referring MD:             CCM team Medicines:                per CCM Complications:            No immediate complications. Estimated Blood Loss:     Estimated blood loss: none. Procedure:                Pre-Anesthesia Assessment:                           - Prior to the procedure, a History and Physical                            was performed, and patient medications and                            allergies were reviewed. The patient's tolerance of                            previous anesthesia was also reviewed. The risks                            and benefits of the procedure and the sedation                            options and risks were discussed with the patient.                            All questions were answered, and informed consent                            was obtained. Prior Anticoagulants: The patient has                            taken no previous anticoagulant or antiplatelet                            agents. ASA Grade Assessment: V - A moribund                            patient who is not expected to survive without the  operation. After reviewing the risks and benefits,                            the patient was deemed in satisfactory condition to                            undergo the procedure.                           After obtaining informed consent, the endoscope was                            passed under direct vision. Throughout the                            procedure, the  patient's blood pressure, pulse, and                            oxygen saturations were monitored continuously. The                            GIF-H190 ([id number]) Olympus gastroscope was                            introduced through the mouth, and advanced to the                            second part of duodenum. The upper GI endoscopy was                            accomplished without difficulty. The patient                            tolerated the procedure well. Scope In: Scope Out: Findings:      The examined esophagus was normal.      The Z-line was regular and was found 32 cm from the incisors.      One non-bleeding superficial gastric ulcer with adherent clot was found       on the greater curvature of the stomach. The lesion was 6 mm in largest       dimension.      Patchy mild inflammation characterized by congestion (edema) was found       in the entire examined stomach.      The cardia and gastric fundus were normal on retroflexion.      The examined duodenum was normal.      There is no endoscopic evidence of bleeding in the entire examined       stomach.      There is no endoscopic evidence of bleeding in the entire examined       duodenum.      -Oral gastric tube in place. Impression:               - Normal esophagus.                           - Z-line regular, 32 cm  from the incisors.                           - Non-bleeding gastric ulcer with adherent clot                            likely due to oral gastric tube trauma.                           - Acute gastritis.                           - Normal examined duodenum.                           - No specimens collected.                           - Source of patient's GI bleed not found on EGD. Recommendation:           - Observe patient's clinical course.                           - Proceed with flexible sigmoidoscopy. Procedure Code(s):        --- Professional ---                           [phone],  Esophagogastroduodenoscopy, flexible,                            transoral; diagnostic, including collection of                            specimen(s) by brushing or washing, when performed                            (separate procedure) Diagnosis Code(s):        --- Professional ---                           K92.2, Gastrointestinal hemorrhage, unspecified                           K92.1, Melena (includes Hematochezia)                           K29.00, Acute gastritis without bleeding                           K25.4, Chronic or unspecified gastric ulcer with                            hemorrhage CPT copyright 2018 American [employer]. All rights reserved. The codes documented in this report are preliminary and upon coder review may  be revised to meet current compliance requirements. Lear Ng, MD 01/23/2018 3:00:08 PM This report has been signed electronically. Number of Addenda: 0

## 2018-01-23 NOTE — Addendum Note (Signed)
Addendum  created 01/21/2018 1928 by Suzy Bouchard, CRNA   Intraprocedure Event edited, Intraprocedure Flowsheets edited, Intraprocedure Staff edited

## 2018-01-23 NOTE — Progress Notes (Signed)
CRITICAL VALUE ALERT  Critical Value: Hgb 6.0  Date & Time Notied:  01/23/17 at [place]  Provider Notified: Kirby,NP  Orders Received/Actions taken: Kirby,NP at bedside and pt will be transferred to ICU due to continuous bleeding

## 2018-01-23 NOTE — Procedures (Signed)
Central Venous Catheter Insertion Procedure Note Rhodie Cienfuegos [id number] 10-30-1970  Procedure: Insertion of Central Venous Catheter Indications: Assessment of intravascular volume, Drug and/or fluid administration and Frequent blood sampling  Procedure Details Consent: Unable to obtain consent because of emergent medical necessity. Time Out: Verified patient identification, verified procedure, site/side was marked, verified correct patient position, special equipment/implants available, medications/allergies/relevent history reviewed, required imaging and test results available.  Performed  Maximum sterile technique was used including antiseptics, cap, gloves, gown, hand hygiene, mask and sheet. Skin prep: Chlorhexidine; local anesthetic administered A antimicrobial bonded/coated triple lumen catheter was placed in the right internal jugular vein using the Seldinger technique.  Evaluation Blood flow good Complications: No apparent complications Patient did tolerate procedure well. Chest X-ray ordered to verify placement.  CXR: normal.  Procedure performed under direct supervision with ultrasound guidance for real time vessel cannulation.     Obed Blossom Hoops 01/12/2018, 3:44 PM

## 2018-01-23 NOTE — Interval H&P Note (Signed)
History and Physical Interval Note:  02/11/2018 2:23 PM  Katherine Kramer  has presented today for surgery, with the diagnosis of GI bleed  The various methods of treatment have been discussed with the patient and family. After consideration of risks, benefits and other options for treatment, the patient has consented to  Procedure(s): ESOPHAGOGASTRODUODENOSCOPY (EGD) (N/A) as a surgical intervention .  The patient's history has been reviewed, patient examined, no change in status, stable for surgery.  I have reviewed the patient's chart and labs.  Questions were answered to the patient's satisfaction.     Lear Ng

## 2018-01-23 NOTE — Consult Note (Signed)
Chief Complaint: Patient was seen in consultation today for  Chief Complaint  Patient presents with  . Altered Mental Status   at the request of Kara Mead, MD  Referring Physician(s): Dr. Kara Mead  Patient Status: [facility] - In-pt  History of Present Illness: Katherine Kramer is a 48 y.o. female has chronic kidney disease with cervical cancer diagnosed in 2014 s/p pelvic lad/oophorectomy (per [facility] note), chemo and ebrt/brachytherapy completed 8/67 that was complicated by radiation proctitis and bilateral ureteral obstruction s/p bilateral perc nephs, s/p sigmoid perforation and hartmanns in 2017,  colostomy dilatation for ostomy retraction (09/2015), colostomy revision (01/2016) and Hartmann's closure with diverting loop ileostomy (06/2016) who presented 11/05/2017 for ileostomy takedown.   She is currently admitted for mental status changes.  She then was noted to have large amounts brbpr and was coded for 6 minutes in pea. She was resuscitated. She has received six units of blood and one of ffp at this point.  She then underwent flex sig with Dr Michail Sermon.    She has evidence of proctitis and active bleeding which may be coming from an old anastomosis.  The precise location of the bleeding is difficult to ascertain.  Upper endoscopy is negative.   Past Medical History:  Diagnosis Date  . Cervical cancer ([place])   . CKD (chronic kidney disease)   . Radiation proctitis 2014   [facility]    Past Surgical History:  Procedure Laterality Date  . COLECTOMY WITH COLOSTOMY CREATION/HARTMANN PROCEDURE  04/2015   [facility].  Sigmoid colon perforation.   . COLOSTOMY REVISION  09/2015   [facility].  Dilitation  . COLOSTOMY REVISION  01/2016   [facility]  . COLOSTOMY TAKEDOWN  06/2016   [facility].  colostomy takedown w diverting loop ileostomy  . ILEO LOOP COLOSTOMY CLOSURE  11/05/2017   [facility]  . ILEO LOOP DIVERSION  06/2016   [facility].  with colostomy takedown  . INSERTION BRACHYTHERAPY DEVICE  2014    Insertion of Smit sleeve  for brachytherapy radiation.  . IR NEPHROSTOMY EXCHANGE LEFT  01/21/2018  . IR NEPHROSTOMY EXCHANGE RIGHT  01/21/2018  . NEPHROSTOMY TUBE PLACEMENT ([place] HX)  201?  Marland Kitchen PELVIC LYMPH NODE DISSECTION  05/2012   [facility]    Allergies: Iodine; Iodinated diagnostic agents; and Piperacillin-tazobactam in dex  Medications: Prior to Admission medications   Medication Sig Start Date End Date Taking? Authorizing Provider  acetaminophen (TYLENOL) 500 MG tablet Take 1,000 mg by mouth every 8 (eight) hours as needed for pain. 11/10/17  Yes [provider]  ALPRAZolam Duanne Moron) 1 MG tablet Take 1 mg by mouth at bedtime as needed for anxiety.   Yes [provider]  alprazolam Duanne Moron) 2 MG tablet Take 2 mg by mouth 2 (two) times daily as needed for anxiety. 03/24/17  Yes [provider]  fentaNYL (DURAGESIC - DOSED MCG/HR) 75 MCG/HR Place 1 patch onto the skin every 3 (three) days. 03/22/17  Yes [provider]  gabapentin (NEURONTIN) 300 MG capsule Take 300 mg by mouth 3 (three) times daily. 11/10/17  Yes [provider]  ondansetron (ZOFRAN) 8 MG tablet Take 8 mg by mouth as needed for nausea/vomiting.   Yes [provider]  oxyCODONE (ROXICODONE) 15 MG immediate release tablet Take 15 mg by mouth every 4 (four) hours as needed for pain.   Yes [provider]  phenazopyridine (PYRIDIUM) 95 MG tablet Take 95 mg by mouth 3 (three) times daily as needed for pain.   Yes [provider]  senna (SENNA-TIME) 8.6 MG tablet Take 1 tablet by mouth 2 (two) times daily.   Yes [provider]  sodium bicarbonate 650 MG tablet Take 650 mg by mouth 2 (two) times daily. 03/25/17 03/25/18 Yes [provider]  Vitamin D, Ergocalciferol, (DRISDOL) 1.25 MG (50000 UT) CAPS capsule Take 50,000 Units by mouth once a week. 03/25/17 03/25/18 Yes [provider]     Family History  Family history unknown: Yes    Social History   Socioeconomic  History  . Marital status: Single    Spouse name: Not on file  . Number of children: Not on file  . Years of education: Not on file  . Highest education level: Not on file  Occupational History  . Not on file  Social Needs  . Financial resource strain: Not on file  . Food insecurity:    Worry: Not on file    Inability: Not on file  . Transportation needs:    Medical: Not on file    Non-medical: Not on file  Tobacco Use  . Smoking status: Current Every Day Smoker  . Smokeless tobacco: Never Used  Substance and Sexual Activity  . Alcohol use: Not on file  . Drug use: Not on file  . Sexual activity: Not on file  Lifestyle  . Physical activity:    Days per week: Not on file    Minutes per session: Not on file  . Stress: Not on file  Relationships  . Social connections:    Talks on phone: Not on file    Gets together: Not on file    Attends religious service: Not on file    Active member of club or organization: Not on file    Attends meetings of clubs or organizations: Not on file    Relationship status: Not on file  Other Topics Concern  . Not on file  Social History Narrative  . Not on file     Review of Systems: A 12 point ROS discussed and pertinent positives are indicated in the HPI above.  All other systems are negative.  Review of Systems  Vital Signs: BP (!) 158/107 Comment: Arterial BP  Pulse (!) 127   Temp (!) 96.7 F (35.9 C) (Axillary)   Resp (!) 30   Ht 5\' 2"  (1.575 m)   Wt 46.6 kg   SpO2 (!) 80%   BMI 18.79 kg/m   Imaging: Ct Head Wo Contrast  Result Date: 01/18/2018 CLINICAL DATA:  Altered level of consciousness (LOC), unexplained EXAM: CT HEAD WITHOUT CONTRAST TECHNIQUE: Contiguous axial images were obtained from the base of the skull through the vertex without intravenous contrast. COMPARISON:  None. FINDINGS: Brain: No intracranial hemorrhage, mass effect, or midline shift. No hydrocephalus. The basilar cisterns are patent. No evidence of  territorial infarct or acute ischemia. No extra-axial or intracranial fluid collection. Vascular: No hyperdense vessel. Skull: No fracture or focal lesion. Sinuses/Orbits: Paranasal sinuses and mastoid air cells are clear. The visualized orbits are unremarkable. Other: None. IMPRESSION: Negative head CT. Electronically Signed   By: Keith Rake M.D.   On: 01/28/2018 20:41   US Renal  Result Date: 01/20/2018 CLINICAL DATA:  Initial evaluation for renal failure, bilateral nephrostomy tubes in place. EXAM: RENAL / URINARY TRACT ULTRASOUND COMPLETE COMPARISON:  None. FINDINGS: Right Kidney: Renal measurements: 8.4 x 5.0 x 5.0 cm = volume: 110 mL . Echogenicity grossly within normal limits. No mass lesion. Severe hydronephrosis. Nephrostomy tube partially visualize within  the dilated right renal collecting system. Left Kidney: Renal measurements: 9.5 x 4.6 x 4.6 cm = volume: 107 mL. Echogenicity grossly within normal limits. No lesion. Moderate left-sided hydronephrosis. Bladder: Not assessed on this examination. IMPRESSION: Moderate to severe bilateral hydronephrosis, right greater than left. Bedside check to evaluate nephrostomy tube function and patency recommended. Electronically Signed   By: Jeannine Boga M.D.   On: 01/20/2018 04:29   Dg Chest Port 1 View  Result Date: 01/22/2018 CLINICAL DATA:  Central venous catheter placement at bedside. EXAM: PORTABLE CHEST 1 VIEW 3:42 p.m.: COMPARISON:  Chest x-rays earlier same day and 02/11/2018. FINDINGS: RIGHT jugular dual-lumen central venous catheter tip projects at or near the cavoatrial junction. No evidence of pneumothorax or mediastinal hematoma. LEFT subclavian central venous catheter tip projects at or near the cavoatrial junction. Endotracheal tube tip projects approximately 2 cm above the carina. External pacing pads are present. Suboptimal inspiration which accounts for atelectasis in the lung bases, LEFT greater than RIGHT. Mild pulmonary  venous hypertension without overt edema currently. Lungs otherwise clear. IMPRESSION: 1. RIGHT jugular dual-lumen central venous catheter tip projects at or near the cavoatrial junction. No acute complicating features. 2. Remaining support apparatus satisfactory. 3. Suboptimal inspiration accounts for bibasilar atelectasis, LEFT greater than RIGHT. No acute cardiopulmonary disease otherwise. Electronically Signed   By: Evangeline Dakin M.D.   On: 01/30/2018 16:21   Dg Chest Port 1 View  Result Date: 01/12/2018 CLINICAL DATA:  Patient status post intubation EXAM: PORTABLE CHEST 1 VIEW COMPARISON:  Chest radiograph 02/10/2018 FINDINGS: ET tube terminates in the mid trachea. Left subclavian central venous catheter tip projects over the superior vena cava. Monitoring leads overlie the patient. Enteric tube courses inferior to the diaphragm. Pacer pads overlie the patient. Low lung volumes. No large area pulmonary consolidation. No pleural effusion or pneumothorax. IMPRESSION: Support apparatus as above. No consolidative pulmonary opacities. Electronically Signed   By: Lovey Newcomer M.D.   On: 02/07/2018 09:54   Dg Chest Port 1 View  Result Date: 01/26/2018 CLINICAL DATA:  Central line placement EXAM: PORTABLE CHEST 1 VIEW COMPARISON:  02/09/2018 FINDINGS: Left-sided central venous catheter tip over the cavoatrial region. Endotracheal tube tip is 3.7 cm superior to the carina. Lungs are clear. Heart size is normal. No pneumothorax. IMPRESSION: Left-sided central venous catheter tip overlies the cavoatrial region. No pneumothorax. Endotracheal tube tip about 3.7 cm superior to the carina. Electronically Signed   By: Donavan Foil M.D.   On: 02/02/2018 02:24   Dg Abdomen Acute W/chest  Result Date: 01/31/2018 CLINICAL DATA:  Hypotension with nephrostomy tubes and history of ovarian cancer. EXAM: DG ABDOMEN ACUTE W/ 1V CHEST COMPARISON:  None. FINDINGS: There is no evidence of dilated bowel loops or free  intraperitoneal air. Moderate stool retention within the colon. Bilateral pigtail ureteral calculi are noted as well as bilateral external nephrostomy tubes without complicating features. No radiopaque calculi or other significant radiographic abnormality is seen. Heart size and mediastinal contours are within normal limits. Both lungs are clear. IMPRESSION: Moderate stool retention within the colon without bowel obstruction. Bilateral ureteral and nephrostomy tubes are in place without complicating features. No active pulmonary disease. Electronically Signed   By: Ashley Royalty M.D.   On: 01/16/2018 21:55   Dg Abd Portable 1v  Result Date: 02/03/2018 CLINICAL DATA:  NG tube placement EXAM: PORTABLE ABDOMEN - 1 VIEW COMPARISON:  01/21/2018 FINDINGS: Enteric tube tip is in the left upper quadrant consistent with location in the body of  the stomach. Bilateral nephrostomy tubes and ureteral stents are demonstrated. Gas-filled small and large bowel without significant distention, likely ileus. IMPRESSION: Enteric tube tip is in the left upper quadrant consistent with location in the body of the stomach. Electronically Signed   By: Lucienne Capers M.D.   On: 01/15/2018 02:35   Ir Nephrostomy Exchange Left  Result Date: 01/21/2018 INDICATION: 48 year old female with a history of cervical cancer and bilateral ureteral obstruction. She has bilateral percutaneous nephrostomy tubes which were placed at another institution. They have not been changed since September of 2019. She is currently admitted as an inpatient and presents for nephrostomy tube check and exchange. EXAM: Nephrostomy tube exchange, right Nephrostomy tube exchange, left COMPARISON:  None. MEDICATIONS: None ANESTHESIA/SEDATION: None CONTRAST:  10 mL Isovue 370-administered into the collecting system(s) FLUOROSCOPY TIME:  Fluoroscopy Time: 1 minutes 6 seconds (1.8 mGy). COMPLICATIONS: None immediate. PROCEDURE: Informed written consent was obtained from  the patient after a thorough discussion of the procedural risks, benefits and alternatives. All questions were addressed. Maximal Sterile Barrier Technique was utilized including caps, mask, sterile gowns, sterile gloves, sterile drape, hand hygiene and skin antiseptic. A timeout was performed prior to the initiation of the procedure. Attention was first turned to the left percutaneous nephrostomy tube. A gentle hand injection of contrast material was performed. The tube is located within a lower pole calyx, just within the collecting system. The tube was transected and a Bentson wire successfully navigated through the tube and back into the renal pelvis. The tube was removed over the wire. A new 10 [place] cook all-purpose drainage catheter was advanced over the wire and formed in the renal pelvis. An image was obtained and stored for the medical record. The tube was connected to gravity bag drainage. Attention was next turned to the right percutaneous nephrostomy tube. A gentle hand injection of contrast material was performed. The tube is present within the upper pole infundibulum. The tube was transected and removed over a Bentson wire. A new Cook 10 French percutaneous nephrostomy tube was advanced over the wire into the renal pelvis where it was successfully formed. An image was obtained and stored for the medical record. Overall, the patient tolerated the procedure well. Of note, there are bilateral double-J ureteral stents which appear to be occluded. IMPRESSION: Successful exchange of bilateral percutaneous nephrostomy tubes. The bilateral internal double-J ureteral stents appear to be occluded. PLAN: Return to interventional Radiology in 8 weeks for scheduled bilateral nephrostomy tube exchange. Signed, Criselda Peaches, MD, [facility] Electronically Signed   By: Jacqulynn Cadet M.D.   On: 01/21/2018 13:09   Ir Nephrostomy Exchange  Right  Result Date: 01/21/2018 INDICATION: 48 year old female with a history of cervical cancer and bilateral ureteral obstruction. She has bilateral percutaneous nephrostomy tubes which were placed at another institution. They have not been changed since September of 2019. She is currently admitted as an inpatient and presents for nephrostomy tube check and exchange. EXAM: Nephrostomy tube exchange, right Nephrostomy tube exchange, left COMPARISON:  None. MEDICATIONS: None ANESTHESIA/SEDATION: None CONTRAST:  10 mL Isovue 370-administered into the collecting system(s) FLUOROSCOPY TIME:  Fluoroscopy Time: 1 minutes 6 seconds (1.8 mGy). COMPLICATIONS: None immediate. PROCEDURE: Informed written consent was obtained from the patient after a thorough discussion of the procedural risks, benefits and alternatives. All questions were addressed. Maximal Sterile Barrier Technique was utilized including caps, mask, sterile gowns, sterile gloves, sterile drape, hand hygiene and skin antiseptic. A timeout was  performed prior to the initiation of the procedure. Attention was first turned to the left percutaneous nephrostomy tube. A gentle hand injection of contrast material was performed. The tube is located within a lower pole calyx, just within the collecting system. The tube was transected and a Bentson wire successfully navigated through the tube and back into the renal pelvis. The tube was removed over the wire. A new 10 [place] cook all-purpose drainage catheter was advanced over the wire and formed in the renal pelvis. An image was obtained and stored for the medical record. The tube was connected to gravity bag drainage. Attention was next turned to the right percutaneous nephrostomy tube. A gentle hand injection of contrast material was performed. The tube is present within the upper pole infundibulum. The tube was transected and removed over a Bentson wire. A new Cook 10 French percutaneous nephrostomy tube was  advanced over the wire into the renal pelvis where it was successfully formed. An image was obtained and stored for the medical record. Overall, the patient tolerated the procedure well. Of note, there are bilateral double-J ureteral stents which appear to be occluded. IMPRESSION: Successful exchange of bilateral percutaneous nephrostomy tubes. The bilateral internal double-J ureteral stents appear to be occluded. PLAN: Return to interventional Radiology in 8 weeks for scheduled bilateral nephrostomy tube exchange. Signed, Criselda Peaches, MD, [facility] Electronically Signed   By: Jacqulynn Cadet M.D.   On: 01/21/2018 13:09    Labs:  CBC: Recent Labs    02/09/2018 0245 02/04/2018 0900 01/30/2018 1128 02/04/2018 1157 01/26/2018 1407  WBC 17.8* 30.3* 37.3*  --  15.1*  HGB 7.1* 6.6* 13.6 10.0* 14.5  HCT 22.7* 22.0* 40.5 31.6* 42.5  PLT 234 147* 145*  --  36*    COAGS: Recent Labs    02/04/2018 0042 01/15/2018 1407  INR 1.35 1.67  APTT 31  --     BMP: Recent Labs    01/16/2018 0042 01/18/2018 0245 01/31/2018 0900 01/14/2018 1407  NA 143 145 150* 147*  K 3.8 3.4* 4.5 2.7*  CL 118* 119* 128* 122*  CO2 15* 13* <7* 10*  GLUCOSE 133* 260* 157* 274*  BUN 55* 51* 52* 53*  CALCIUM 7.6* 6.4* 7.4* 5.8*  CREATININE 3.24* 2.97* 2.98* 2.67*  GFRNONAA 16* 18* 18* 21*  GFRAA 19* 21* 21* 24*    LIVER FUNCTION TESTS: Recent Labs    01/20/2018 2000 01/20/18 0329 01/21/2018 0245 02/11/2018 0900  BILITOT 0.6 0.9 0.4 0.6  AST 20 16 18 28   ALT 9 6 8 10   ALKPHOS 67 51 29* 37*  PROT 7.5 5.8* 3.6* <3.0*  ALBUMIN 2.5* 1.9* 1.6* 1.3*    TUMOR MARKERS: No results for input(s): AFPTM, CEA, CA199, CHROMGRNA in the last 8760 hours.  Assessment and Plan:  Massive lower GI bleed requiring at least 6 unit packed red blood cell transfusion.  Patient has also coded 3 times today.  Bleeding source appears to be coming from a old anastomosis in the  distal colon/rectum by endoscopy.  Patient is extremely high risk for surgery currently given her tenuous clinical status, history of prior surgery and radiation therapy.  We will proceed with emergent mesenteric arteriography and potential embolization.  She has a questionable allergy to contrast and chronic kidney disease.  Given the emergent nature of her condition, we will bypass contrast allergy premedication and we have to accept the risk of contrast-induced nephropathy.  She may require dialysis if she  is able to pull through her current life-threatening condition.  I discussed all of this with her family who understands and is in agreement to proceed.  1.)  Mesenteric angiogram and possible embolization  Thank you for this interesting consult.  I greatly enjoyed meeting Cassi Jenne and look forward to participating in their care.  A copy of this report was sent to the requesting provider on this date.  Electronically Signed: Jacqulynn Cadet, MD 01/15/2018, 4:31 PM   I spent a total of 20 Minutes  in face to face in clinical consultation, greater than 50% of which was counseling/coordinating care for massive lower GI bleed.

## 2018-01-23 NOTE — H&P (View-Only) (Signed)
Referring Provider: Dr. Carson Myrtle Primary Care Physician:  Patient, No Pcp Per Primary Gastroenterologist:  UNASSIGNED  Reason for Consultation:  GI bleed  HPI: Katherine Kramer is a 48 y.o. female with history of cervical cancer (2014) s/p chemoradiation that was complicated by radiation proctitis and bilateral ureteral strictures. 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 Patient admitted for altered mental status who is thought to have taken too many narcotic pain meds. Yesterday she had the acute onset of large amounts of bright red blood per rectum with the first episode being about 300 cc with signs of hemorrhagic shock and was transferred to the ICU. She is s/p 3 U PRBCs and 2 U FFP. Hgb decrease from 8.9 on 01/21/18 to 7.4 on 01/22/18 and 6.0 at midnight last night. Hgb 6.6 now and on multiple pressors. Code blue for 6 minutes on patient this morning. Patient intubated, sedated, and large amount of bright red blood in bed.  Past Medical History:  Diagnosis Date  . Cervical cancer ([place])   . CKD (chronic kidney disease)     Past Surgical History:  Procedure Laterality Date  . bilatteral salpingo oopherectomy    . COLON SURGERY    . IR NEPHROSTOMY EXCHANGE LEFT  01/21/2018  . IR NEPHROSTOMY EXCHANGE RIGHT  01/21/2018  . NEPHROSTOMY      Prior to Admission medications   Medication Sig Start Date End Date Taking? Authorizing Provider  acetaminophen (TYLENOL) 500 MG tablet Take 1,000 mg by mouth every 8 (eight) hours as needed for pain. 11/10/17  Yes [provider]  ALPRAZolam Duanne Moron) 1 MG tablet Take 1 mg by mouth at bedtime as needed for anxiety.   Yes [provider]  alprazolam Duanne Moron) 2 MG tablet Take 2 mg by mouth 2 (two) times daily as needed for anxiety. 03/24/17  Yes [provider]  fentaNYL (DURAGESIC - DOSED MCG/HR) 75 MCG/HR Place 1 patch onto the skin every 3 (three) days. 03/22/17  Yes [provider]   gabapentin (NEURONTIN) 300 MG capsule Take 300 mg by mouth 3 (three) times daily. 11/10/17  Yes [provider]  ondansetron (ZOFRAN) 8 MG tablet Take 8 mg by mouth as needed for nausea/vomiting.   Yes [provider]  oxyCODONE (ROXICODONE) 15 MG immediate release tablet Take 15 mg by mouth every 4 (four) hours as needed for pain.   Yes [provider]  phenazopyridine (PYRIDIUM) 95 MG tablet Take 95 mg by mouth 3 (three) times daily as needed for pain.   Yes [provider]  senna (SENNA-TIME) 8.6 MG tablet Take 1 tablet by mouth 2 (two) times daily.   Yes [provider]  sodium bicarbonate 650 MG tablet Take 650 mg by mouth 2 (two) times daily. 03/25/17 03/25/18 Yes [provider]  Vitamin D, Ergocalciferol, (DRISDOL) 1.25 MG (50000 UT) CAPS capsule Take 50,000 Units by mouth once a week. 03/25/17 03/25/18 Yes [provider]    Scheduled Meds: . sodium chloride   Intravenous Once  . sodium chloride   Intravenous Once  . sodium chloride   Intravenous Once  . sodium chloride   Intravenous Once  . chlorhexidine gluconate (MEDLINE KIT)  15 mL Mouth Rinse BID  . Chlorhexidine Gluconate Cloth  6 each Topical Daily  . EPINEPHrine  1 mg Intravenous Once  . fentaNYL (SUBLIMAZE) injection  50 mcg Intravenous Once  . mouth rinse  15 mL Mouth Rinse 10 times per day  . [  START ON 01/26/2018] pantoprazole  40 mg Intravenous Q12H  . polyethylene glycol  17 g Oral Daily  . rocuronium  25 mg Intravenous Once  . sodium bicarbonate  50 mEq Intravenous Once  . sodium chloride flush  10-40 mL Intracatheter Q12H  . vancomycin variable dose per unstable renal function (pharmacist dosing)   Does not apply See admin instructions   Continuous Infusions: . fentaNYL infusion INTRAVENOUS 200 mcg/hr (01/26/2018 0500)  . meropenem (MERREM) IV    . metronidazole 500 mg (02/04/2018 0540)  . norepinephrine (LEVOPHED) Adult infusion    . pantoprozole  (PROTONIX) infusion 8 mg/hr (01/18/2018 0500)  . phenylephrine (NEO-SYNEPHRINE) Adult infusion 150 mcg/min (01/27/2018 0820)  . sodium chloride    . vancomycin     PRN Meds:.acetaminophen **OR** acetaminophen, fentaNYL, morphine injection, ondansetron **OR** ondansetron (ZOFRAN) IV, sodium chloride flush  Allergies as of 02/09/2018  . (Not on File)    Family History  Family history unknown: Yes    Social History   Socioeconomic History  . Marital status: Single    Spouse name: Not on file  . Number of children: Not on file  . Years of education: Not on file  . Highest education level: Not on file  Occupational History  . Not on file  Social Needs  . Financial resource strain: Not on file  . Food insecurity:    Worry: Not on file    Inability: Not on file  . Transportation needs:    Medical: Not on file    Non-medical: Not on file  Tobacco Use  . Smoking status: Current Every Day Smoker  . Smokeless tobacco: Never Used  Substance and Sexual Activity  . Alcohol use: Not on file  . Drug use: Not on file  . Sexual activity: Not on file  Lifestyle  . Physical activity:    Days per week: Not on file    Minutes per session: Not on file  . Stress: Not on file  Relationships  . Social connections:    Talks on phone: Not on file    Gets together: Not on file    Attends religious service: Not on file    Active member of club or organization: Not on file    Attends meetings of clubs or organizations: Not on file    Relationship status: Not on file  . Intimate partner violence:    Fear of current or ex partner: Not on file    Emotionally abused: Not on file    Physically abused: Not on file    Forced sexual activity: Not on file  Other Topics Concern  . Not on file  Social History Narrative  . Not on file    Review of Systems: All negative except as stated above in HPI.  Physical Exam: Vital signs: Vitals:   01/27/2018 0741 02/10/2018 0753  BP:    Pulse:    Resp:     Temp: (!) 96.7 F (35.9 C)   SpO2:  100%  P 124, BP 103/89  Last BM Date: 01/18/2018 General: Sedated, intubated, thin, chronically ill appearing Head: normocephalic, atraumatic Eyes: anicteric sclera ENT: oropharynx clear Lungs:  Coarse breath sounds anteriorly Heart:  Regular rate and rhythm; no murmurs, clicks, rubs,  or gallops. Abdomen: flat, mild distention, nontender (no facial grimace), decreased bowel sounds  Rectal:  Deferred Ext: no edema  GI:  Lab Results: Recent Labs    01/22/2018 0042 02/08/2018 0245 02/02/2018 0900  WBC 14.4* 17.8* 30.3*  HGB 6.0* 7.1* 6.6*  HCT 19.4* 22.7* 22.0*  PLT 582* 234 147*   BMET Recent Labs    01/22/18 1549 02/10/2018 0042 01/12/2018 0245  NA 144 143 145  K 3.6 3.8 3.4*  CL 115* 118* 119*  CO2 17* 15* 13*  GLUCOSE 105* 133* 260*  BUN 56* 55* 51*  CREATININE 3.58* 3.24* 2.97*  CALCIUM 8.3* 7.6* 6.4*   LFT Recent Labs    02/07/2018 0245  PROT 3.6*  ALBUMIN 1.6*  AST 18  ALT 8  ALKPHOS 29*  BILITOT 0.4   PT/INR Recent Labs    02/11/2018 0042  LABPROT 16.5*  INR 1.35     Studies/Results: Dg Chest Port 1 View  Result Date: 01/26/2018 CLINICAL DATA:  Central line placement EXAM: PORTABLE CHEST 1 VIEW COMPARISON:  01/20/2018 FINDINGS: Left-sided central venous catheter tip over the cavoatrial region. Endotracheal tube tip is 3.7 cm superior to the carina. Lungs are clear. Heart size is normal. No pneumothorax. IMPRESSION: Left-sided central venous catheter tip overlies the cavoatrial region. No pneumothorax. Endotracheal tube tip about 3.7 cm superior to the carina. Electronically Signed   By: Donavan Foil M.D.   On: 01/22/2018 02:24   Dg Abd Portable 1v  Result Date: 02/04/2018 CLINICAL DATA:  NG tube placement EXAM: PORTABLE ABDOMEN - 1 VIEW COMPARISON:  01/21/2018 FINDINGS: Enteric tube tip is in the left upper quadrant consistent with location in the body of the stomach. Bilateral nephrostomy tubes and ureteral stents are  demonstrated. Gas-filled small and large bowel without significant distention, likely ileus. IMPRESSION: Enteric tube tip is in the left upper quadrant consistent with location in the body of the stomach. Electronically Signed   By: Lucienne Capers M.D.   On: 02/08/2018 02:35   Ir Nephrostomy Exchange Left  Result Date: 01/21/2018 INDICATION: 48 year old female with a history of cervical cancer and bilateral ureteral obstruction. She has bilateral percutaneous nephrostomy tubes which were placed at another institution. They have not been changed since September of 2019. She is currently admitted as an inpatient and presents for nephrostomy tube check and exchange. EXAM: Nephrostomy tube exchange, right Nephrostomy tube exchange, left COMPARISON:  None. MEDICATIONS: None ANESTHESIA/SEDATION: None CONTRAST:  10 mL Isovue 370-administered into the collecting system(s) FLUOROSCOPY TIME:  Fluoroscopy Time: 1 minutes 6 seconds (1.8 mGy). COMPLICATIONS: None immediate. PROCEDURE: Informed written consent was obtained from the patient after a thorough discussion of the procedural risks, benefits and alternatives. All questions were addressed. Maximal Sterile Barrier Technique was utilized including caps, mask, sterile gowns, sterile gloves, sterile drape, hand hygiene and skin antiseptic. A timeout was performed prior to the initiation of the procedure. Attention was first turned to the left percutaneous nephrostomy tube. A gentle hand injection of contrast material was performed. The tube is located within a lower pole calyx, just within the collecting system. The tube was transected and a Bentson wire successfully navigated through the tube and back into the renal pelvis. The tube was removed over the wire. A new 10 [place] cook all-purpose drainage catheter was advanced over the wire and formed in the renal pelvis. An image was obtained and stored for the medical record. The tube was connected to gravity bag  drainage. Attention was next turned to the right percutaneous nephrostomy tube. A gentle hand injection of contrast material was performed. The tube is present within the upper pole infundibulum. The tube was transected and removed over a Bentson wire. A new Cook 10 French percutaneous nephrostomy tube was advanced  over the wire into the renal pelvis where it was successfully formed. An image was obtained and stored for the medical record. Overall, the patient tolerated the procedure well. Of note, there are bilateral double-J ureteral stents which appear to be occluded. IMPRESSION: Successful exchange of bilateral percutaneous nephrostomy tubes. The bilateral internal double-J ureteral stents appear to be occluded. PLAN: Return to interventional Radiology in 8 weeks for scheduled bilateral nephrostomy tube exchange. Signed, Criselda Peaches, MD, [facility] Radiology Electronically Signed   By: Jacqulynn Cadet M.D.   On: 01/21/2018 13:09   Ir Nephrostomy Exchange Right  Result Date: 01/21/2018 INDICATION: 48 year old female with a history of cervical cancer and bilateral ureteral obstruction. She has bilateral percutaneous nephrostomy tubes which were placed at another institution. They have not been changed since September of 2019. She is currently admitted as an inpatient and presents for nephrostomy tube check and exchange. EXAM: Nephrostomy tube exchange, right Nephrostomy tube exchange, left COMPARISON:  None. MEDICATIONS: None ANESTHESIA/SEDATION: None CONTRAST:  10 mL Isovue 370-administered into the collecting system(s) FLUOROSCOPY TIME:  Fluoroscopy Time: 1 minutes 6 seconds (1.8 mGy). COMPLICATIONS: None immediate. PROCEDURE: Informed written consent was obtained from the patient after a thorough discussion of the procedural risks, benefits and alternatives. All questions were addressed. Maximal Sterile Barrier Technique was utilized including  caps, mask, sterile gowns, sterile gloves, sterile drape, hand hygiene and skin antiseptic. A timeout was performed prior to the initiation of the procedure. Attention was first turned to the left percutaneous nephrostomy tube. A gentle hand injection of contrast material was performed. The tube is located within a lower pole calyx, just within the collecting system. The tube was transected and a Bentson wire successfully navigated through the tube and back into the renal pelvis. The tube was removed over the wire. A new 10 [place] cook all-purpose drainage catheter was advanced over the wire and formed in the renal pelvis. An image was obtained and stored for the medical record. The tube was connected to gravity bag drainage. Attention was next turned to the right percutaneous nephrostomy tube. A gentle hand injection of contrast material was performed. The tube is present within the upper pole infundibulum. The tube was transected and removed over a Bentson wire. A new Cook 10 French percutaneous nephrostomy tube was advanced over the wire into the renal pelvis where it was successfully formed. An image was obtained and stored for the medical record. Overall, the patient tolerated the procedure well. Of note, there are bilateral double-J ureteral stents which appear to be occluded. IMPRESSION: Successful exchange of bilateral percutaneous nephrostomy tubes. The bilateral internal double-J ureteral stents appear to be occluded. PLAN: Return to interventional Radiology in 8 weeks for scheduled bilateral nephrostomy tube exchange. Signed, Criselda Peaches, MD, [facility] Radiology Electronically Signed   By: Jacqulynn Cadet M.D.   On: 01/21/2018 13:09    Impression/Plan: 48 yo with hemorrhagic shock in the setting of previous ostomy takedown in October following colon perforation and has history of radiation proctitis. OG tube without any blood seen in  tubing. I do not think she is having an upper tract source of bleeding and think this is small bowel or colon and needs a bleeding scan and a noncontrast CT scan to further evaluate. If those are unrevealing, then may need an EGD to evaluate further. Continue aggressive volume resuscitation and Protonix drip. D/W Dr. Elsworth Soho who will order the bleeding scan and  CT. Dr. Adriana Mccallum will f/u on this unassigned patient tomorrow.    LOS: 4 days   Lear Ng  01/29/2018, 9:31 AM  Questions please call [phone]

## 2018-01-23 NOTE — Progress Notes (Signed)
Prior admission at [place] date: 11/05/2017  Discharge date and time: November 10, 2017 9:51 AM  Discharge to: Home  Discharge Service: Durenda Hurt [facility])  Discharge Attending Physician: Hermine Messick, MD  Discharge Diagnoses: loop ileostomy reversal  Secondary Diagnosis: Active Problems: * No active hospital problems. * Resolved Problems: * No resolved hospital problems. *  OR Procedures:  CLO ENTEROSTOMY LG/SM INTEST ileostomy tkdwn Date 11/05/2017 -------------------  Ancillary Procedures: no procedures  Discharge Day Services:  The patient was seen and examined by the Trauma Surgery team on the day of discharge. Vital signs and laboratory values were stable and within normal limits. Prior ostomy site c/d/i loosely closed with sutures. Serous drainage noted on guaze. No surrounding erythema. Discharge plan was discussed, instructions were given and all questions answered  Subjective  No acute events overnight.  Objective  Patient Vitals for the past 8 hrs: BP Temp Temp src Pulse Resp SpO2  11/10/17 0441 123/85 37.2 C (99 F) Oral 97 18 99 %   No intake/output data recorded.  Hospital Course: Katherine Kramer [id number] 48 y.o. 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   Operative findings were loop ileostomy with stenotic orifice. Moderate intra-abdominal adhesions. The procedure itself was  uneventful and uncomplicated. She tolerated the procedure well, was extubated in the OR, and received routine post-operative care before being transferred to the floor. Her diet was advanced and by discharge she was tolerating a regular diet. She was voiding adequately, ambulating independently, and her pain was controlled wth PO pain medications. She was examined by the Trauma Surgery team on the day of discharge and was deemed suitable for discharge home. She will be discharged on POD #5 in good condition.  I spent great than 30 minutes completing this discharge.   Condition at Discharge: Improved Discharge Medications:   Medication List   START taking these medications  acetaminophen 500 MG tablet Commonly known as: TYLENOL Take 2 tablets (1,000 mg total) by mouth every eight (8) hours.  gabapentin 300 MG capsule Commonly known as: NEURONTIN Take 1 capsule (300 mg total) by mouth Three (3) times a day.   CONTINUE taking these medications  ALPRAZolam 2 MG tablet Commonly known as: XANAX  ergocalciferol 50,000 unit capsule Commonly known as: DRISDOL Take 1 capsule (50,000 Units total) by mouth once a week.  fentaNYL 75 mcg/hr Commonly known as: DURAGESIC  ondansetron 8 MG tablet Commonly known as: ZOFRAN  oxyCODONE 15 MG immediate release tablet Commonly known as: ROXICODONE  SENNA 8.6 mg tablet Generic drug: senna  sodium bicarbonate 650 mg tablet Take 1 tablet (650 mg total) by mouth Two (2) times a day.    Pending Test Results: None  Discharge Instructions: Activity:  Activity Instructions  Activity as tolerated  Lifting restrictions (specify)  Weight restriction of 10 lbs till seen in follow up visit.  OK to shower (no bath)    Diet: Diet Instructions  Discharge diet (specify)  Discharge Nutrition Therapy: General    Other Instructions:  Labs and Other Follow-ups after Discharge: Follow Up instructions and Outpatient Referrals  Call MD for:  persistent nausea or vomiting  Call MD for: redness, tenderness, or signs of infection (pain, swelling, redness, odor or green/yellow discharge around incision site)  Call MD for: severe uncontrolled pain  Call MD for: temperature >38.5 Celsius (101.3 Fahrenheit)  Discharge instructions  DIET: You may advance your diet to regular, as tolerated. You may experience bloating or have loose, watery stool for several days after surgery.  INCISION: Look at your incisions at least twice a day. A small amount of drainage is normal, especially in the first couple of days after surgery. If you notice any redness around your incisions that spreads along your skin, or any thick, yellow drainage, you should call the clinic. Avoid wearing tight clothing. Wound care:   Sutures will be removed in clinic.  ACTIVITY: You may shower after 24 hours, but do not soak in a tub for 2 to 3 weeks. No lifting greater than 10 pounds or strenuous activity until seen by the doctor in your follow-up visit.  PAIN MEDICATION: Do not drive or drink alcohol while taking prescription pain medication. Take your prescribed pain medication only as needed. You may decrease the amount of pain medication as tolerated, and take Tylenol or a Motrin product as directed on the label. Pain medication can cause constipation. To avoid this, you should take an over-the-counter stool softener, such as docusate 100mg  1-2 times daily.   Effective July 11, 2015, the [place]antha [place]en signed a [place] law that works to address the opioid crisis our state is facing. It is called the STOP Law, for Strengthen Opioid Misuse Prevention.   As part of this law, we are limited to prescribing no more than a seven-day supply of opioid pain medicine for our surgery patients initally. (This law also limits prescriptions for patients who have not had surgery to a five-day supply.)  Because opioid prescriptions cannot be renewed electronically or over the phone, a  paper prescription must be presented to the pharmacy. Therefore, a patient must contact our office at least 2-3 business days before you anticipate you will run out of pain medication so that we can develop a plan for you. The best number to call is [phone]. The on-call provider will NOT be able to renew your prescription.  The STOP Act also monitors the amount of controlled substances that each patient has received from any and all providers. Prescriptions for each patient will be monitored by the [place] Controlled Substances Reporting System in accordance with the law. Thank you for working with [place] so we can provide good pain control in the safest way possible while you recover.   Please note that prescription pain medication refills will not be called into your pharmacy. If you feel that you are needing more pain medication, you will need to be seen in clinic or the Emergency Department for further evaluation to ensure you are not experiencing a complication. Please take note of how many pills you have left in your bottle, and if you are starting to run low and feel that you will need more for adequate pain control, to call the clinic as soon as possible to schedule an appointment, as our clinics are very busy, and we cannot guarantee same week appointments.  The Trauma Surgery office number is [phone]. For emergencies at night or on the week-end, please call [phone]) and ask for the surgery resident on call.  WHEN TO CALL THE SURGEON'S OFFICE: 1. Thick, yellow drainage from your incisions, redness at  incisions, bleeding, or separations of wounds. 2. Temperature greater than 101.5 3. Uncontrolled nausea or vomiting. 4. Pain that is not controlled by your pain medication   Standard Patient Notification Script We care deeply about how our patients' pain is managed after surgery. During the month after you have surgery, you may receive a call from [place] asking about your postsurgical pain  and about how you managed that pain. The survey will take approximately 5 to 10 minutes and will help [place] to provide better care to our patients. Thank you in advance for your participation and please answer any calls coming from the [facility] or [place] area!  Follow Up [facility] 1st Floor Women's and Children's  [address] [place], [place] [address] [phone]  1-2 weeks with Dr. Wynetta Emery. If you have not been contacted regarding an follow-up appointment date and time within a few days, please call to schedule. Please note, if you arrive more than 20 minutes late, you will need to reschedule your appointment  *For support and information on substance abuse you may call the [facility]. It is free, confidential 24/7, 365 day-a-year treatment referral and information service (in [place] and [place]) for individuals and families facing mental and/or substance use disorders. 1-800-662-HELP (4357).     Future Appointments: Appointments which have been scheduled for you  Nov 10, 2017 11:30 AM EDT (Arrive by 11:15 AM) RETURN GENERAL with Princella Pellegrini Denu-Ciocca, MD [place] [phone]   Nov 25, 2017 12:30 PM EST (Arrive by 12:00 PM) POST OP with Hermine Messick, MD [facility] GEN AND ACUTE CARE SURG [place] [phone]   Dec 16, 2017 10:30 AM EST (Arrive by 10:00 AM) RETURN GENERAL with Verne Spurr, MD [place] [phone]   Jan 03, 2018 11:00 AM EST (Arrive by 10:00 AM) IR CHANGE NEPHROSTOMY TUBE BILATERAL with [facility] VIR RM 4 IMG VASCULAR INTERVENTIONAL H&V [facility]) [place] [phone]  On appt date: Come with adult to accompany pt home Bring recent lab work Bring any meds you take Take meds w/small sip of water Check w/physician about current meds Check w/physician if diabetic Check w/physician if pt takes blood thinners Arrive 1 hr early  On appt date do not: Consume solids after midnight Consume anything 2 hrs  Let us know if pt: Pregnant Allergic to iodine or contrast dyes Prior arterial or vascular graft operations History of unstable angina (Title:IRGEN)      Electronically signed by Nathaniel Man, MD at 11/10/2017 12:32 PM EDT    Associated attestation - Nathaniel Man, MD - 11/10/2017 12:32 PM EDT  ATTESTATION  I discussed the patient with the APP and agree with the evaluation and plan in their note.   Melody Haver MD

## 2018-01-23 NOTE — Anesthesia Postprocedure Evaluation (Signed)
Anesthesia Post Note  Patient: Katherine Kramer  Procedure(s) Performed: IR WITH ANESTHESIA/ACTIVE BLEEDING (N/A )     Patient location during evaluation: SICU Anesthesia Type: General Level of consciousness: sedated Pain management: pain level controlled Vital Signs Assessment: vitals unstable Respiratory status: patient remains intubated per anesthesia plan Cardiovascular status: unstable Postop Assessment: no apparent nausea or vomiting Anesthetic complications: no    Last Vitals:  Vitals:   01/15/2018 1530 02/07/2018 1813  BP:    Pulse:    Resp:    Temp:    SpO2: (!) 85% (!) 65%    Last Pain:  Vitals:   01/30/2018 0741  TempSrc: Axillary  PainSc:                  Sorina Derrig DANIEL

## 2018-01-23 NOTE — Progress Notes (Addendum)
Patient ID: Katherine Kramer, female   DOB: 04-06-1970, 48 y.o.   MRN: [id number] Abdomen progressively distended.  To IR, I viewed procedure, MTP was activated with anesthesia resuscitatng. Became clear clinical picture when noted to have ruptured right iliac. Stented with no more bleeding.   Would check cbc and dic panel and give products accordingly. dont think could even really decompress her honestly but I suppose would be option if needed. I dont think a bladder pressure would even be accurate in her so would have to go with clinical findings- of which I think would only be pulmonary. I think she likely already has renal failure given preexisting disease, hypotension and necessary dye load

## 2018-01-23 NOTE — Progress Notes (Addendum)
Pt transfer to  78M ICU due to Pt severe bleeding out while using the bathroom. Notified NP on call and new stat order received and put in action. Pt continue bleeding and ST. NP at bed side and transfer to 78M. Notified sister of pt episode and transfer per pt request.

## 2018-01-23 NOTE — Progress Notes (Signed)
48 year old woman with a history of advanced cervical cancer since 2014 status post chemo RT and bilateral nephrostomy tubes admitted 1/8 for bright red blood per rectum and AKI She has chronic pain on opiates and Xanax  PCCM was emergently consulted 1/12 early a.m. for hemodynamic collapse and altered mental status.  She was intubated and resuscitated with fluids and Neo-Synephrine.  Hemoglobin noted to be 7.1 with a lactate of 4.6.  She was noted to be awake and interactive on rounds at 8 AM but soon after developed PEA arrest with a pool of frank blood via rectum.  ROSC was achieved after 6 minutes of CPR and epi x2, she was resuscitated with Levophed drip and 2 units PRBC emergently transfused.  Hemoglobin prior to transfusion was 6.6.  Exam otherwise-pale, soft nontender abdomen, on fentanyl drip, no edema, cool extremities  Chest x-ray personally reviewed which shows ET tube in positions and no new infiltrates. Labs show hemoglobin of 6.6 increased lactate postarrest, albumin 1.3, creatinine decreased to 2.9 from 5.5 on admit, increase sodium to 150 and bicarbonate less than 7.  Impression/plan  Hemorrhagic shock/PEA arrest-resuscitated with blood and fluids We will place arterial line to guide resuscitation Ct levophed and minimize Neo-Synephrine  Lower GI bleed-proceed with tagged RBC scan once hemodynamically stable for transport, GI also recommending CT abdomen to rule out acute abdomen although her exam is benign  Acute respiratory failure-ventilator settings were reviewed and adjusted  AKI/metabolic acidosis/lactic acidosis-creatinine seems to be improving, lactate related to arrest and should resolve as she has regained circulation, will use bicarbonate drip instead of normal saline given severe hyperchloremia  Doubt sepsis but will continue empiric antibiotics, nephrostomy tubes have been recently changed  Guarded prognosis given to sister at bedside  The patient is  critically ill with multiple organ systems failure and requires high complexity decision making for assessment and support, frequent evaluation and titration of therapies, application of advanced monitoring technologies and extensive interpretation of multiple databases. Critical Care Time devoted to patient care services described in this note independent of APP/resident  time is 60 minutes.    Leanna Sato Elsworth Soho MD

## 2018-01-23 NOTE — Progress Notes (Signed)
Critical values relayed to MD post code, see new orders.

## 2018-01-23 NOTE — Consult Note (Signed)
Referring Provider: Dr. Jeong Primary Care Physician:  Patient, No Pcp Per Primary Gastroenterologist:  UNASSIGNED  Reason for Consultation:  GI bleed  HPI: Katherine Kramer is a 48 y.o. female with history of cervical cancer (2014) s/p chemoradiation that was complicated by radiation proctitis and bilateral ureteral strictures. 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 Patient admitted for altered mental status who is thought to have taken too many narcotic pain meds. Yesterday she had the acute onset of large amounts of bright red blood per rectum with the first episode being about 300 cc with signs of hemorrhagic shock and was transferred to the ICU. She is s/p 3 U PRBCs and 2 U FFP. Hgb decrease from 8.9 on 01/21/18 to 7.4 on 01/22/18 and 6.0 at midnight last night. Hgb 6.6 now and on multiple pressors. Code blue for 6 minutes on patient this morning. Patient intubated, sedated, and large amount of bright red blood in bed.  Past Medical History:  Diagnosis Date  . Cervical cancer (HCC)   . CKD (chronic kidney disease)     Past Surgical History:  Procedure Laterality Date  . bilatteral salpingo oopherectomy    . COLON SURGERY    . IR NEPHROSTOMY EXCHANGE LEFT  01/21/2018  . IR NEPHROSTOMY EXCHANGE RIGHT  01/21/2018  . NEPHROSTOMY      Prior to Admission medications   Medication Sig Start Date End Date Taking? Authorizing Provider  acetaminophen (TYLENOL) 500 MG tablet Take 1,000 mg by mouth every 8 (eight) hours as needed for pain. 11/10/17  Yes [provider]  ALPRAZolam (XANAX) 1 MG tablet Take 1 mg by mouth at bedtime as needed for anxiety.   Yes [provider]  alprazolam (XANAX) 2 MG tablet Take 2 mg by mouth 2 (two) times daily as needed for anxiety. 03/24/17  Yes [provider]  fentaNYL (DURAGESIC - DOSED MCG/HR) 75 MCG/HR Place 1 patch onto the skin every 3 (three) days. 03/22/17  Yes [provider]   gabapentin (NEURONTIN) 300 MG capsule Take 300 mg by mouth 3 (three) times daily. 11/10/17  Yes [provider]  ondansetron (ZOFRAN) 8 MG tablet Take 8 mg by mouth as needed for nausea/vomiting.   Yes [provider]  oxyCODONE (ROXICODONE) 15 MG immediate release tablet Take 15 mg by mouth every 4 (four) hours as needed for pain.   Yes [provider]  phenazopyridine (PYRIDIUM) 95 MG tablet Take 95 mg by mouth 3 (three) times daily as needed for pain.   Yes [provider]  senna (SENNA-TIME) 8.6 MG tablet Take 1 tablet by mouth 2 (two) times daily.   Yes [provider]  sodium bicarbonate 650 MG tablet Take 650 mg by mouth 2 (two) times daily. 03/25/17 03/25/18 Yes [provider]  Vitamin D, Ergocalciferol, (DRISDOL) 1.25 MG (50000 UT) CAPS capsule Take 50,000 Units by mouth once a week. 03/25/17 03/25/18 Yes [provider]    Scheduled Meds: . sodium chloride   Intravenous Once  . sodium chloride   Intravenous Once  . sodium chloride   Intravenous Once  . sodium chloride   Intravenous Once  . chlorhexidine gluconate (MEDLINE KIT)  15 mL Mouth Rinse BID  . Chlorhexidine Gluconate Cloth  6 each Topical Daily  . EPINEPHrine  1 mg Intravenous Once  . fentaNYL (SUBLIMAZE) injection  50 mcg Intravenous Once  . mouth rinse  15 mL Mouth Rinse 10 times per day  . [  START ON 01/26/2018] pantoprazole  40 mg Intravenous Q12H  . polyethylene glycol  17 g Oral Daily  . rocuronium  25 mg Intravenous Once  . sodium bicarbonate  50 mEq Intravenous Once  . sodium chloride flush  10-40 mL Intracatheter Q12H  . vancomycin variable dose per unstable renal function (pharmacist dosing)   Does not apply See admin instructions   Continuous Infusions: . fentaNYL infusion INTRAVENOUS 200 mcg/hr (02/03/2018 0500)  . meropenem (MERREM) IV    . metronidazole 500 mg (01/16/2018 0540)  . norepinephrine (LEVOPHED) Adult infusion    . pantoprozole  (PROTONIX) infusion 8 mg/hr (01/29/2018 0500)  . phenylephrine (NEO-SYNEPHRINE) Adult infusion 150 mcg/min (01/17/2018 0820)  . sodium chloride    . vancomycin     PRN Meds:.acetaminophen **OR** acetaminophen, fentaNYL, morphine injection, ondansetron **OR** ondansetron (ZOFRAN) IV, sodium chloride flush  Allergies as of 01/17/2018  . (Not on File)    Family History  Family history unknown: Yes    Social History   Socioeconomic History  . Marital status: Single    Spouse name: Not on file  . Number of children: Not on file  . Years of education: Not on file  . Highest education level: Not on file  Occupational History  . Not on file  Social Needs  . Financial resource strain: Not on file  . Food insecurity:    Worry: Not on file    Inability: Not on file  . Transportation needs:    Medical: Not on file    Non-medical: Not on file  Tobacco Use  . Smoking status: Current Every Day Smoker  . Smokeless tobacco: Never Used  Substance and Sexual Activity  . Alcohol use: Not on file  . Drug use: Not on file  . Sexual activity: Not on file  Lifestyle  . Physical activity:    Days per week: Not on file    Minutes per session: Not on file  . Stress: Not on file  Relationships  . Social connections:    Talks on phone: Not on file    Gets together: Not on file    Attends religious service: Not on file    Active member of club or organization: Not on file    Attends meetings of clubs or organizations: Not on file    Relationship status: Not on file  . Intimate partner violence:    Fear of current or ex partner: Not on file    Emotionally abused: Not on file    Physically abused: Not on file    Forced sexual activity: Not on file  Other Topics Concern  . Not on file  Social History Narrative  . Not on file    Review of Systems: All negative except as stated above in HPI.  Physical Exam: Vital signs: Vitals:   01/17/2018 0741 02/07/2018 0753  BP:    Pulse:    Resp:     Temp: (!) 96.7 F (35.9 C)   SpO2:  100%  P 124, BP 103/89  Last BM Date: 01/18/2018 General: Sedated, intubated, thin, chronically ill appearing Head: normocephalic, atraumatic Eyes: anicteric sclera ENT: oropharynx clear Lungs:  Coarse breath sounds anteriorly Heart:  Regular rate and rhythm; no murmurs, clicks, rubs,  or gallops. Abdomen: flat, mild distention, nontender (no facial grimace), decreased bowel sounds  Rectal:  Deferred Ext: no edema  GI:  Lab Results: Recent Labs    01/30/2018 0042 01/21/2018 0245 01/30/2018 0900  WBC 14.4* 17.8* 30.3*    HGB 6.0* 7.1* 6.6*  HCT 19.4* 22.7* 22.0*  PLT 582* 234 147*   BMET Recent Labs    01/22/18 1549 01/22/2018 0042 01/22/2018 0245  NA 144 143 145  K 3.6 3.8 3.4*  CL 115* 118* 119*  CO2 17* 15* 13*  GLUCOSE 105* 133* 260*  BUN 56* 55* 51*  CREATININE 3.58* 3.24* 2.97*  CALCIUM 8.3* 7.6* 6.4*   LFT Recent Labs    02/10/2018 0245  PROT 3.6*  ALBUMIN 1.6*  AST 18  ALT 8  ALKPHOS 29*  BILITOT 0.4   PT/INR Recent Labs    01/12/2018 0042  LABPROT 16.5*  INR 1.35     Studies/Results: Dg Chest Port 1 View  Result Date: 02/09/2018 CLINICAL DATA:  Central line placement EXAM: PORTABLE CHEST 1 VIEW COMPARISON:  01/17/2018 FINDINGS: Left-sided central venous catheter tip over the cavoatrial region. Endotracheal tube tip is 3.7 cm superior to the carina. Lungs are clear. Heart size is normal. No pneumothorax. IMPRESSION: Left-sided central venous catheter tip overlies the cavoatrial region. No pneumothorax. Endotracheal tube tip about 3.7 cm superior to the carina. Electronically Signed   By: Kim  Fujinaga M.D.   On: 02/01/2018 02:24   Dg Abd Portable 1v  Result Date: 02/08/2018 CLINICAL DATA:  NG tube placement EXAM: PORTABLE ABDOMEN - 1 VIEW COMPARISON:  01/21/2018 FINDINGS: Enteric tube tip is in the left upper quadrant consistent with location in the body of the stomach. Bilateral nephrostomy tubes and ureteral stents are  demonstrated. Gas-filled small and large bowel without significant distention, likely ileus. IMPRESSION: Enteric tube tip is in the left upper quadrant consistent with location in the body of the stomach. Electronically Signed   By: William  Stevens M.D.   On: 01/22/2018 02:35   Ir Nephrostomy Exchange Left  Result Date: 01/21/2018 INDICATION: 47-year-old female with a history of cervical cancer and bilateral ureteral obstruction. She has bilateral percutaneous nephrostomy tubes which were placed at another institution. They have not been changed since September of 2019. She is currently admitted as an inpatient and presents for nephrostomy tube check and exchange. EXAM: Nephrostomy tube exchange, right Nephrostomy tube exchange, left COMPARISON:  None. MEDICATIONS: None ANESTHESIA/SEDATION: None CONTRAST:  10 mL Isovue 370-administered into the collecting system(s) FLUOROSCOPY TIME:  Fluoroscopy Time: 1 minutes 6 seconds (1.8 mGy). COMPLICATIONS: None immediate. PROCEDURE: Informed written consent was obtained from the patient after a thorough discussion of the procedural risks, benefits and alternatives. All questions were addressed. Maximal Sterile Barrier Technique was utilized including caps, mask, sterile gowns, sterile gloves, sterile drape, hand hygiene and skin antiseptic. A timeout was performed prior to the initiation of the procedure. Attention was first turned to the left percutaneous nephrostomy tube. A gentle hand injection of contrast material was performed. The tube is located within a lower pole calyx, just within the collecting system. The tube was transected and a Bentson wire successfully navigated through the tube and back into the renal pelvis. The tube was removed over the wire. A new 10 French cook all-purpose drainage catheter was advanced over the wire and formed in the renal pelvis. An image was obtained and stored for the medical record. The tube was connected to gravity bag  drainage. Attention was next turned to the right percutaneous nephrostomy tube. A gentle hand injection of contrast material was performed. The tube is present within the upper pole infundibulum. The tube was transected and removed over a Bentson wire. A new Cook 10 French percutaneous nephrostomy tube was advanced   over the wire into the renal pelvis where it was successfully formed. An image was obtained and stored for the medical record. Overall, the patient tolerated the procedure well. Of note, there are bilateral double-J ureteral stents which appear to be occluded. IMPRESSION: Successful exchange of bilateral percutaneous nephrostomy tubes. The bilateral internal double-J ureteral stents appear to be occluded. PLAN: Return to interventional Radiology in 8 weeks for scheduled bilateral nephrostomy tube exchange. Signed, Heath K. McCullough, MD, RPVI [facility] Electronically Signed   By: Heath  McCullough M.D.   On: 01/21/2018 13:09   Ir Nephrostomy Exchange Right  Result Date: 01/21/2018 INDICATION: 47-year-old female with a history of cervical cancer and bilateral ureteral obstruction. She has bilateral percutaneous nephrostomy tubes which were placed at another institution. They have not been changed since September of 2019. She is currently admitted as an inpatient and presents for nephrostomy tube check and exchange. EXAM: Nephrostomy tube exchange, right Nephrostomy tube exchange, left COMPARISON:  None. MEDICATIONS: None ANESTHESIA/SEDATION: None CONTRAST:  10 mL Isovue 370-administered into the collecting system(s) FLUOROSCOPY TIME:  Fluoroscopy Time: 1 minutes 6 seconds (1.8 mGy). COMPLICATIONS: None immediate. PROCEDURE: Informed written consent was obtained from the patient after a thorough discussion of the procedural risks, benefits and alternatives. All questions were addressed. Maximal Sterile Barrier Technique was utilized including  caps, mask, sterile gowns, sterile gloves, sterile drape, hand hygiene and skin antiseptic. A timeout was performed prior to the initiation of the procedure. Attention was first turned to the left percutaneous nephrostomy tube. A gentle hand injection of contrast material was performed. The tube is located within a lower pole calyx, just within the collecting system. The tube was transected and a Bentson wire successfully navigated through the tube and back into the renal pelvis. The tube was removed over the wire. A new 10 French cook all-purpose drainage catheter was advanced over the wire and formed in the renal pelvis. An image was obtained and stored for the medical record. The tube was connected to gravity bag drainage. Attention was next turned to the right percutaneous nephrostomy tube. A gentle hand injection of contrast material was performed. The tube is present within the upper pole infundibulum. The tube was transected and removed over a Bentson wire. A new Cook 10 French percutaneous nephrostomy tube was advanced over the wire into the renal pelvis where it was successfully formed. An image was obtained and stored for the medical record. Overall, the patient tolerated the procedure well. Of note, there are bilateral double-J ureteral stents which appear to be occluded. IMPRESSION: Successful exchange of bilateral percutaneous nephrostomy tubes. The bilateral internal double-J ureteral stents appear to be occluded. PLAN: Return to interventional Radiology in 8 weeks for scheduled bilateral nephrostomy tube exchange. Signed, Heath K. McCullough, MD, RPVI [facility] Electronically Signed   By: Heath  McCullough M.D.   On: 01/21/2018 13:09    Impression/Plan: 47 yo with hemorrhagic shock in the setting of previous ostomy takedown in October following colon perforation and has history of radiation proctitis. OG tube without any blood seen in  tubing. I do not think she is having an upper tract source of bleeding and think this is small bowel or colon and needs a bleeding scan and a noncontrast CT scan to further evaluate. If those are unrevealing, then may need an EGD to evaluate further. Continue aggressive volume resuscitation and Protonix drip. D/W Dr. Alva who will order the bleeding scan and   CT. Dr. Hung/Mann will f/u on this unassigned patient tomorrow.    LOS: 4 days   Nekayla Heider C Eisa Conaway  01/18/2018, 9:31 AM  Questions please call [phone]  

## 2018-01-23 NOTE — Progress Notes (Signed)
Critical ABG results RBV by Dr. Kara Mead at 1130 on 01/21/2018 by Otho Ket, RRT. No vent changes to be made at this time.

## 2018-01-23 NOTE — Procedures (Signed)
Endotracheal Intubation Procedure Note  Indication for endotracheal intubation: airway compromise and impending respiratory failure. Airway Assessment: Mallampati Class: III (soft and hard palate and base of uvula visible). Sedation: etomidate and midazolam. Paralytic: rocuronium. Lidocaine: no. Atropine: no. Equipment: 7.55mm cuffed endotracheal tube. Cricoid Pressure: yes. Number of attempts: 2. ETT location confirmed by by auscultation, by CXR and ETCO2 monitor.  Renee Pain, MD Board Certified by the ABIM, Pulmonary Diseases & Critical Care Medicine   01/28/2018

## 2018-01-23 NOTE — Consult Note (Addendum)
NAMEJolyssa Kramer MRN:  [id number] DOB:  Jan 03, 1971 LOS: 4 ADMISSION DATE:  01/25/2018  CONSULTATION DATE:  01/30/2018 REFERRING MD:  Dr. Jenean Lindau  REASON FOR CONSULTATION:  BRBPR, shock   Initial Pulmonary/Critical Care Consultation  Brief History   N/A  History of present illness   This 48 y.o. Caucasian female smoker is seen in consultation at the request of Dr. Vernell Leep for recommendations on further evaluation and management of bright red blood per rectum and hemodynamic compromise.  The patient transferred to ICU after having hemodynamic collapse and altered mental status subsequent to bright red blood per rectum (estimated to be approximately 300 cc).  Upon arrival to the ICU, the patient was obtunded.  She was pale.  She did have a palpable pulse but was profoundly hypotensive (systolic blood pressure 31S).  She was still arousable to noxious stimuli.  Due to inability to protect her airway and impending cardiopulmonary arrest, patient was intubated, placed on mechanical ventilatory support.  During this time, volume resuscitation was attempted; however, it was apparent that her peripheral IV access was unsatisfactory.  Central venous catheter was placed in the left subclavian vein.  She was originally presented to [facility] emergency department on 01/30/2018 with complaints of altered mental status/lethargy.  She was noted to be hypotensive in triage.  She was also noted to be confused, suspected to reflect toxic encephalopathy, likely secondary to an infectious process.  Of note, the patient has bilateral nephrostomy tubes  REVIEW OF SYSTEMS This patient is critically ill and cannot provide additional history nor review of systems due to mental status/unconsciousness and endotracheally intubated.   Past Medical/Surgical/Social/Family History   Past Medical History:  Diagnosis Date  . Cervical cancer ([place])   . CKD (chronic kidney disease)     Past Surgical  History:  Procedure Laterality Date  . bilatteral salpingo oopherectomy    . COLON SURGERY    . IR NEPHROSTOMY EXCHANGE LEFT  01/21/2018  . IR NEPHROSTOMY EXCHANGE RIGHT  01/21/2018  . NEPHROSTOMY      Social History   Tobacco Use  . Smoking status: Current Every Day Smoker  . Smokeless tobacco: Never Used  Substance Use Topics  . Alcohol use: Not on file    Family History  Family history unknown: Yes    Significant Hospital Events   1/8: Admitted with complaints of altered mental status/lethargy, hypotension.  Clinical suspicion for urosepsis. 1/12: Had 300 cc bright red blood per rectum followed by subsequent precipitous hemodynamic collapse.  Transferred to ICU.   Consults:  N/A   Procedures:  B nephrostomy   Significant Diagnostic Tests:  N/A   Micro Data:   Results for orders placed or performed during the hospital encounter of 02/07/2018  Blood Culture (routine x 2)     Status: None (Preliminary result)   Collection Time: 01/13/2018  7:30 PM  Result Value Ref Range Status   Specimen Description BLOOD LEFT ANTECUBITAL  Final   Special Requests   Final    BOTTLES DRAWN AEROBIC AND ANAEROBIC Blood Culture results may not be optimal due to an inadequate volume of blood received in culture bottles   Culture   Final    NO GROWTH 3 DAYS Performed at [facility], [place] [address] [place], [place] [address]    Report Status PENDING  Incomplete  Blood Culture (routine x 2)     Status: None (Preliminary result)   Collection Time: 02/02/2018  7:45 PM  Result Value Ref Range Status   Specimen Description BLOOD RIGHT ANTECUBITAL  Final   Special Requests   Final    BOTTLES DRAWN AEROBIC ONLY Blood Culture results may not be optimal due to an inadequate volume of blood received in culture bottles   Culture   Final    NO GROWTH 3 DAYS Performed at [facility] [address], [place], [place] [address]    Report Status PENDING  Incomplete       Antimicrobials:  N/A  Interim history/subjective:  N/A   Objective   BP (!) 118/97   Pulse 71   Temp 99.5 F (37.5 C) (Oral)   Resp (!) 24   Ht 5\' 2"  (1.575 m)   Wt 46.6 kg   SpO2 100%   BMI 18.79 kg/m     Filed Weights   01/20/18 0801 01/21/18 0609  Weight: 52 kg 46.6 kg    Intake/Output Summary (Last 24 hours) at 02/11/2018 0156 Last data filed at 01/22/2018 2300 Gross per 24 hour  Intake 1260.72 ml  Output 2625 ml  Net -1364.28 ml    Vent Mode: PRVC FiO2 (%):  [100 %] 100 % Set Rate:  [14 bmp] 14 bmp Vt Set:  [460 mL] 460 mL PEEP:  [5 cmH20] 5 cmH20   Examination: GENERAL: Intubated. lethargic/drowsy, arousable to noxious stimuli.  Pale.  Chronically ill in appearance. HEAD: normocephalic, atraumatic EYE: Conjunctiva pallor.  No scleral icterus.  PERRLA, EOM intact. NOSE: nares are patent.  THROAT/ORAL CAVITY: Normal dentition. Oral thrush. No exudate. Mucous membranes are moist. No tonsillar enlargement. ETT in situ. Mallampati class II (hard and soft palate, upper portion of tonsils anduvula visible) airway. NECK: supple, no thyromegaly, no JVD, no lymphadenopathy. Trachea midline. CHEST/LUNG: symmetric in development and expansion. Good air entry. No crackles. No wheezes. HEART: Regular S1 and S2 without murmur, rub or gallop.  Tachycardic. ABDOMEN: Bright red blood per rectum.  Soft, nontender, nondistended. Normoactive bowel sounds. No rebound. No guarding. No hepatosplenomegaly. EXTREMITIES: Edema: none. No cyanosis. No clubbing. 2+ DP pulses LYMPHATIC: no cervical/axillary/inguinal lymph nodes appreciated MUSCULOSKELETAL: No point tenderness.  No bulk atrophy. SKIN: No rash or lesions. NEUROLOGIC: Cranial nerves II-XII are grossly symmetric and physiologic. Babinski absent. No sensory deficit. Motor: 5/5 @ RUE, 5/5 @ LUE, 5/5 @ RLL,  5/5 @ LLL.  DTR: 2+ @ R biceps, 2+ @ L biceps, 2+ @ R patellar,  2+ @ L patellar. No cerebellar signs. Gait was not  assessed.   Resolved Hospital Problem list   N/A   Assessment & Plan:   ASSESSMENT/PLAN:  ASSESSMENT (included in the Hospital Problem List)  Principal Problem:   Hemorrhagic shock ([place]) Active Problems:   Anemia   Gastrointestinal hemorrhage   ARF (acute renal failure) (HCC)   CKD (chronic kidney disease) stage 4, GFR 15-29 ml/min (HCC)   Endotracheally intubated   Cervical cancer (HCC)   Acute encephalopathy   By systems: PULMONARY  Endotracheally intubated Titrate vent settings based on ABG results Propofol/fentanyl for sedation, as tolerated   CARDIOVASCULAR  Shock, likely multifactorial (hemorrhagic + septic)  Tachycardia, physiologic Check SvO2 Check CVP   RENAL  Chronic kidney disease  Bilateral nephrostomy tubes, suspicious for potential nidus of infection (acute pyelonephritis) Trend lactate Monitor nephrostomy tube output.   GASTROINTESTINAL  GI bleed/bright red blood per rectum  GI PROPHYLAXIS: Protonix gtt Monitor Hgb. Transfuse for Hgb < 8   HEMATOLOGIC  Acute blood loss anemia  DVT PROPHYLAXIS: SCDs  INFECTIOUS  Acute pyelonephritis Empiric meropenem/vancomycin Cultures: nephrostomy output, trach aspirate, blood Check lactate, procalcitonin   ENDOCRINE  No acute issues Check random cortisol   NEUROLOGIC  Encephalopathy, NOS, likely toxometabolic Check ammonia level. Repeat ABG. Check urine drug screen.  Of note, the patient's home medications include Xanax, Duragesic, Roxicodone.   PLAN/RECOMMENDATIONS   Transfer to ICU for further evaluation and management of bright red blood per rectum and shock.    My assessment, plan of care, findings, medications, side effects, etc. were discussed with: nurse and respiratory therapist.   Best practice:  Diet: NPO Pain/Anxiety/Delirium protocol (if indicated): Propofol/fentanyl VAP protocol (if indicated): Yes DVT prophylaxis: SCDs GI prophylaxis: Protonix GTT Glucose  control: Not indicated Mobility/Activity: Bedrest CODE STATUS: Full code Family Communication:  No family at the bedside Disposition:    Labs   CBC: Recent Labs  Lab 02/05/2018 2000 01/20/18 0329 01/20/18 1127 01/21/18 0745 01/22/18 0417 01/22/18 1549 01/30/2018 0042  WBC 9.9 7.9 9.0 8.6 7.4 9.5 14.4*  NEUTROABS 7.2 5.6 6.7  --   --   --   --   HGB 6.3* 6.3* 9.9* 8.9* 7.4* 7.6* 6.0*  HCT 22.1* 22.1* 33.0* 29.3* 23.3* 23.9* 19.4*  MCV 98.7 97.8 95.9 98.3 93.6 90.9 93.3  PLT 847* 623* 774* 742* 616* 625* 582*    Basic Metabolic Panel: Recent Labs  Lab 01/20/18 0329 01/21/18 0745 01/22/18 0417 01/22/18 1549 01/31/2018 0042  NA 138 144 143 144 143  K 4.0 4.7 3.9 3.6 3.8  CL 113* 119* 118* 115* 118*  CO2 14* 12* 15* 17* 15*  GLUCOSE 89 86 101* 105* 133*  BUN 62* 57* 58* 56* 55*  CREATININE 4.78* 4.02* 3.78* 3.58* 3.24*  CALCIUM 7.9* 8.6* 8.2* 8.3* 7.6*   GFR: Estimated Creatinine Clearance: 16.1 mL/min (A) (by C-G formula based on SCr of 3.24 mg/dL (H)). Recent Labs  Lab 02/11/2018 2011 01/13/2018 2204 01/20/18 0329 01/20/18 0749  01/21/18 0745 01/22/18 0417 01/22/18 1549 01/13/2018 0042  PROCALCITON  --   --   --  1.25  --   --   --   --   --   WBC  --   --  7.9  --    < > 8.6 7.4 9.5 14.4*  LATICACIDVEN 0.45* 0.75 0.4*  --   --   --   --   --   --    < > = values in this interval not displayed.    Liver Function Tests: Recent Labs  Lab 02/04/2018 2000 01/20/18 0329  AST 20 16  ALT 9 6  ALKPHOS 67 51  BILITOT 0.6 0.9  PROT 7.5 5.8*  ALBUMIN 2.5* 1.9*   No results for input(s): LIPASE, AMYLASE in the last 168 hours. Recent Labs  Lab 02/03/2018 2000  AMMONIA 15    ABG    Component Value Date/Time   PHART 7.272 (L) 01/20/2018 0732   PCO2ART 31.6 (L) 01/20/2018 0732   PO2ART 85.0 01/20/2018 0732   HCO3 14.7 (L) 01/20/2018 0732   TCO2 16 (L) 01/20/2018 0732   ACIDBASEDEF 11.0 (H) 01/20/2018 0732   O2SAT 95.0 01/20/2018 0732     Coagulation  Profile: Recent Labs  Lab 01/14/2018 0042  INR 1.35    Cardiac Enzymes: Recent Labs  Lab 01/20/18 0749  CKTOTAL 118  TROPONINI <0.03    HbA1C: No results found for: HGBA1C  CBG: Recent Labs  Lab 01/21/18 1320 01/21/18 2000 01/22/18 0148 01/22/18 0826 02/05/2018 0023  GLUCAP 81  91 94 112* 99    Review of Systems:   See above   Past Medical History   Past Medical History:  Diagnosis Date  . Cervical cancer ([place])   . CKD (chronic kidney disease)      Surgical History    Past Surgical History:  Procedure Laterality Date  . bilatteral salpingo oopherectomy    . COLON SURGERY    . IR NEPHROSTOMY EXCHANGE LEFT  01/21/2018  . IR NEPHROSTOMY EXCHANGE RIGHT  01/21/2018  . NEPHROSTOMY       Social History   Social History   Socioeconomic History  . Marital status: Single    Spouse name: Not on file  . Number of children: Not on file  . Years of education: Not on file  . Highest education level: Not on file  Occupational History  . Not on file  Social Needs  . Financial resource strain: Not on file  . Food insecurity:    Worry: Not on file    Inability: Not on file  . Transportation needs:    Medical: Not on file    Non-medical: Not on file  Tobacco Use  . Smoking status: Current Every Day Smoker  . Smokeless tobacco: Never Used  Substance and Sexual Activity  . Alcohol use: Not on file  . Drug use: Not on file  . Sexual activity: Not on file  Lifestyle  . Physical activity:    Days per week: Not on file    Minutes per session: Not on file  . Stress: Not on file  Relationships  . Social connections:    Talks on phone: Not on file    Gets together: Not on file    Attends religious service: Not on file    Active member of club or organization: Not on file    Attends meetings of clubs or organizations: Not on file    Relationship status: Not on file  Other Topics Concern  . Not on file  Social History Narrative  . Not on file     Family  History    Family History  Family history unknown: Yes   Family history is unknown by patient.    Allergies Allergies  Allergen Reactions  . Iodine Other (See Comments)    headache  . Iodinated Diagnostic Agents Other (See Comments)    Pt states the allergy was to IV dye not oral contrast  . Piperacillin-Tazobactam In Dex Other (See Comments)    headache      Current Medications  Current Facility-Administered Medications:  .  0.9 %  sodium chloride infusion (Manually program via Guardrails IV Fluids), , Intravenous, Once, Kirby-Graham, Karsten Fells, NP .  0.9 %  sodium chloride infusion (Manually program via Guardrails IV Fluids), , Intravenous, Once, Kirby-Graham, Karsten Fells, NP .  acetaminophen (TYLENOL) tablet 650 mg, 650 mg, Oral, Q6H PRN **OR** acetaminophen (TYLENOL) suppository 650 mg, 650 mg, Rectal, Q6H PRN, Rise Patience, MD .  Chlorhexidine Gluconate Cloth 2 % PADS 6 each, 6 each, Topical, Daily, Renee Pain, MD .  EPINEPHrine (ADRENALIN) 1 mg, 1 mg, Intravenous, Once, Renee Pain, MD .  midazolam (VERSED) 2 MG/2ML injection, , , ,  .  morphine 2 MG/ML injection 2 mg, 2 mg, Intravenous, Q3H PRN, Kirby-Graham, Karsten Fells, NP .  ondansetron (ZOFRAN) tablet 4 mg, 4 mg, Oral, Q6H PRN **OR** ondansetron (ZOFRAN) injection 4 mg, 4 mg, Intravenous, Q6H PRN, Rise Patience, MD, 4 mg at 01/18/2018 0039 .  pantoprazole (PROTONIX) 80 mg in sodium chloride 0.9 % 250 mL (0.32 mg/mL) infusion, 8 mg/hr, Intravenous, Continuous, Kirby-Graham, Karsten Fells, NP, Last Rate: 25 mL/hr at 01/25/2018 0052, 8 mg/hr at 01/31/2018 0052 .  [START ON 01/26/2018] pantoprazole (PROTONIX) injection 40 mg, 40 mg, Intravenous, Q12H, Kirby-Graham, Karen J, NP .  phenylephrine (NEOSYNEPHRINE) 10-0.9 MG/250ML-% infusion, 0-400 mcg/min, Intravenous, Titrated, Carson Myrtle, Seong-Joo, MD .  polyethylene glycol (MIRALAX / GLYCOLAX) packet 17 g, 17 g, Oral, Daily, Hongalgi, Anand D, MD, 17 g at 01/22/18 1110 .   rocuronium (ZEMURON) injection 25 mg, 25 mg, Intravenous, Once, Renee Pain, MD .  sodium bicarbonate injection 50 mEq, 50 mEq, Intravenous, Once, Renee Pain, MD .  sodium chloride 0.9 % bolus 500 mL, 500 mL, Intravenous, Once, Kirby-Graham, Karsten Fells, NP .  sodium chloride flush (NS) 0.9 % injection 10-40 mL, 10-40 mL, Intracatheter, Q12H, Renee Pain, MD .  sodium chloride flush (NS) 0.9 % injection 10-40 mL, 10-40 mL, Intracatheter, PRN, Renee Pain, MD  Home Medications  Prior to Admission medications   Medication Sig Start Date End Date Taking? Authorizing Provider  acetaminophen (TYLENOL) 500 MG tablet Take 1,000 mg by mouth every 8 (eight) hours as needed for pain. 11/10/17  Yes [provider]  ALPRAZolam Duanne Moron) 1 MG tablet Take 1 mg by mouth at bedtime as needed for anxiety.   Yes [provider]  alprazolam Duanne Moron) 2 MG tablet Take 2 mg by mouth 2 (two) times daily as needed for anxiety. 03/24/17  Yes [provider]  fentaNYL (DURAGESIC - DOSED MCG/HR) 75 MCG/HR Place 1 patch onto the skin every 3 (three) days. 03/22/17  Yes [provider]  gabapentin (NEURONTIN) 300 MG capsule Take 300 mg by mouth 3 (three) times daily. 11/10/17  Yes [provider]  ondansetron (ZOFRAN) 8 MG tablet Take 8 mg by mouth as needed for nausea/vomiting.   Yes [provider]  oxyCODONE (ROXICODONE) 15 MG immediate release tablet Take 15 mg by mouth every 4 (four) hours as needed for pain.   Yes [provider]  phenazopyridine (PYRIDIUM) 95 MG tablet Take 95 mg by mouth 3 (three) times daily as needed for pain.   Yes [provider]  senna (SENNA-TIME) 8.6 MG tablet Take 1 tablet by mouth 2 (two) times daily.   Yes [provider]  sodium bicarbonate 650 MG tablet Take 650 mg by mouth 2 (two) times daily. 03/25/17 03/25/18 Yes [provider]  Vitamin D, Ergocalciferol, (DRISDOL) 1.25 MG (50000 UT)  CAPS capsule Take 50,000 Units by mouth once a week. 03/25/17 03/25/18 Yes [provider]      Critical care time: 90 minutes.  The treatment and management of the patient's condition was required based on the threat of imminent deterioration. This time reflects time spent by the physician evaluating, providing care and managing the critically ill patient's care. The time was spent at the immediate bedside (or on the same floor/unit and dedicated to this patient's care). Time involved in separately billable procedures is NOT included int he critical care time indicated above. Family meeting and update time may be included above if and only if the patient is unable/incompetent to participate in clinical interview and/or decision making, and the discussion was necessary to determining treatment decisions.    Renee Pain, MD Board Certified by the ABIM, [place] Pager: [phone]

## 2018-01-23 NOTE — Addendum Note (Signed)
Addendum  created 02/02/2018 2003 by Suzy Bouchard, CRNA   Intraprocedure Event edited, Intraprocedure Staff edited

## 2018-01-23 NOTE — Consult Note (Signed)
Renal Service Consult Note [facility]  Katherine Kramer 01/31/2018 Sol Blazing Requesting Physician:  Dr Carson Myrtle  Reason for Consult:  Acute on CRF HPI: The patient is a 48 y.o. year-old with hx of cervical Ca IIIB treated w/ chemoradiation in 9450 complicated by radiation proctitis, ureteral strictures/ bilat hydro s/p stenting then bilat PCN's in 09/2016, cecal perforation 04/2015 and CKD IV baseline creat 4.  She also reportedly had takedown of Hartman procedure as well sometime last year.   Patient was admitted on 01/30/2018 w/ AMS and hypotension and AKI on CKD.  Creat was 5.5.  She rec'd IVF"s and BP's improved. Bilat hydro on imaging prompted exchange of bilat PCN's by IR on 1/10 and creat improved to 3.7 on 1/11 and AMS improved.  This am pt deteriorated w/ acute bloody BM then cardiac arrest at 9:45 then again at 11:55 am.  EGD was neg but flex sig showed active bleeding. 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  This was treated w/ a covered stent. Back in ICU ABG shows pH 6.9, BP's 90's on minimal pressor support.  Oxygenation on the vent is poor w/ SpO2 in the 60's. Last CXR at 3pm today did not show any sig infiltrates, poss vasc congestion.  We are asked to see for AKI on CRF.   She is f/b [employer] w/ records as follows:    - 03/2016 sepsis , AKI peak creat 7.9, had JJ stents exchanged     - 06/2016 creat 6.0 due to dehydration,, improved    - 09/2016 found down, Cr 16 , rx'd CRRT and abx , AKI improved, HD dc'd   - jan 2019 complicated UTI, new seizures > PCN"s changed, Cr 3.6-4.5  ROS  n/a   Past Medical History  Past Medical History:  Diagnosis Date  . Cervical cancer ([place])   . CKD (chronic kidney disease)   . Radiation proctitis 2014   [place]   Past Surgical History  Past Surgical History:  Procedure Laterality Date  . COLECTOMY WITH COLOSTOMY  CREATION/HARTMANN PROCEDURE  04/2015   [facility].  Sigmoid colon perforation.   . COLOSTOMY REVISION  09/2015   [facility].  Dilitation  . COLOSTOMY REVISION  01/2016   [facility]  . COLOSTOMY TAKEDOWN  06/2016   [facility].  colostomy takedown w diverting loop ileostomy  . ILEO LOOP COLOSTOMY CLOSURE  11/05/2017   [facility]  . ILEO LOOP DIVERSION  06/2016   [facility].  with colostomy takedown  . INSERTION BRACHYTHERAPY DEVICE  2014    Insertion of Smit sleeve for brachytherapy radiation.  . IR ANGIOGRAM VISCERAL SELECTIVE  01/22/2018  . IR EMBO ART  VEN HEMORR LYMPH EXTRAV  INC GUIDE ROADMAPPING  01/28/2018  . IR NEPHROSTOMY EXCHANGE LEFT  01/21/2018  . IR NEPHROSTOMY EXCHANGE RIGHT  01/21/2018  . IR US GUIDE VASC ACCESS RIGHT  02/08/2018  . IR VENO/EXT/UNI RIGHT  01/25/2018  . NEPHROSTOMY TUBE PLACEMENT ([place] HX)  201?  Marland Kitchen PELVIC LYMPH NODE DISSECTION  05/2012   [facility]   Family History  Family History  Family history unknown: Yes   Social History  reports that she has been smoking. She has never used smokeless tobacco. No history on file for alcohol and drug. Allergies  Allergies  Allergen Reactions  . Iodine Other (See Comments)    headache  . Iodinated Diagnostic Agents Other (See Comments)  Pt states the allergy was to IV dye not oral contrast  . Piperacillin-Tazobactam In Dex Other (See Comments)    headache   Home medications Prior to Admission medications   Medication Sig Start Date End Date Taking? Authorizing Provider  acetaminophen (TYLENOL) 500 MG tablet Take 1,000 mg by mouth every 8 (eight) hours as needed for pain. 11/10/17  Yes [provider]  ALPRAZolam Duanne Moron) 1 MG tablet Take 1 mg by mouth at bedtime as needed for anxiety.   Yes [provider]  alprazolam Duanne Moron) 2 MG tablet Take 2 mg by mouth 2 (two) times daily as needed for anxiety. 03/24/17  Yes [provider]  fentaNYL (DURAGESIC - DOSED MCG/HR) 75 MCG/HR Place 1 patch onto the skin every 3 (three) days. 03/22/17   Yes [provider]  gabapentin (NEURONTIN) 300 MG capsule Take 300 mg by mouth 3 (three) times daily. 11/10/17  Yes [provider]  ondansetron (ZOFRAN) 8 MG tablet Take 8 mg by mouth as needed for nausea/vomiting.   Yes [provider]  oxyCODONE (ROXICODONE) 15 MG immediate release tablet Take 15 mg by mouth every 4 (four) hours as needed for pain.   Yes [provider]  phenazopyridine (PYRIDIUM) 95 MG tablet Take 95 mg by mouth 3 (three) times daily as needed for pain.   Yes [provider]  senna (SENNA-TIME) 8.6 MG tablet Take 1 tablet by mouth 2 (two) times daily.   Yes [provider]  sodium bicarbonate 650 MG tablet Take 650 mg by mouth 2 (two) times daily. 03/25/17 03/25/18 Yes [provider]  Vitamin D, Ergocalciferol, (DRISDOL) 1.25 MG (50000 UT) CAPS capsule Take 50,000 Units by mouth once a week. 03/25/17 03/25/18 Yes [provider]   Liver Function Tests Recent Labs  Lab 01/20/18 0329 01/27/2018 0245 01/26/2018 0900  AST 16 18 28   ALT 6 8 10   ALKPHOS 51 29* 37*  BILITOT 0.9 0.4 0.6  PROT 5.8* 3.6* <3.0*  ALBUMIN 1.9* 1.6* 1.3*   No results for input(s): LIPASE, AMYLASE in the last 168 hours. CBC Recent Labs  Lab 01/14/2018 2000 01/20/18 0329 01/20/18 1127  02/09/2018 1128 02/11/2018 1157 01/27/2018 1407 02/11/2018 1854  WBC 9.9 7.9 9.0   < > 37.3*  --  15.1* 3.6*  NEUTROABS 7.2 5.6 6.7  --   --   --   --   --   HGB 6.3* 6.3* 9.9*   < > 13.6 10.0* 14.5 15.3*  HCT 22.1* 22.1* 33.0*   < > 40.5 31.6* 42.5 47.5*  MCV 98.7 97.8 95.9   < > 88.4  --  86.9 92.2  PLT 847* 623* 774*   < > 145*  --  36* 34*   < > = values in this interval not displayed.   Basic Metabolic Panel Recent Labs  Lab 01/22/18 0417 01/22/18 1549 01/15/2018 0042 02/05/2018 0245 02/03/2018 0900 02/06/2018 1407 02/10/2018 1854  NA 143 144 143 145 150* 147* 152*  K 3.9 3.6 3.8 3.4* 4.5 2.7* 3.3*  CL 118* 115* 118* 119* 128* 122* 123*  CO2  15* 17* 15* 13* <7* 10* 13*  GLUCOSE 101* 105* 133* 260* 157* 274* 225*  BUN 58* 56* 55* 51* 52* 53* 37*  CREATININE 3.78* 3.58* 3.24* 2.97* 2.98* 2.67* 2.13*  CALCIUM 8.2* 8.3* 7.6* 6.4* 7.4* 5.8* 6.9*   Iron/TIBC/Ferritin/ %Sat No results found for: IRON, TIBC, FERRITIN, IRONPCTSAT  Vitals:   01/12/2018 1515 02/02/2018 1530 02/06/2018 1813 01/23/2018  1942  BP:    (!) 91/56  Pulse: (!) 127   (!) 107  Resp: (!) 30     Temp:      TempSrc:      SpO2: (!) 80% (!) 85% (!) 65% (!) 58%  Weight:      Height:       Exam Gen pt unreponsive on vent, pale, purplish skin discoloratino on the face No rash, cyanosis or gangrene Sclera anicteric, throat w ETT  No jvd or bruit Chest mostly clear bilat ant/ lat RRR no MRG tachy Abd firm distended , flank edema bilat, no wounds, dec'd bs R flank PCN draining bloody urine GU deferred MS no joint effusions or deformity Ext diffues 2+  edema, no wounds or ulcers Neuro is sedated on the vent   Assessment: 1. AKI - on admission, was recovering w/ PCN exchange and IVF"s, creat 5.5 > 2.7. Now prob another AKI w/ hemorrhagic shock and cardiac arrests today. Severe metabolic acidosis and sig vol overload on exam. Plan is for CRRT.   2. CKD stage IV - baseline creat 3.3 - 3.9 from Oct 2019. Followed by [facility].  3. 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 L side pulled out today during a procedure, R PCN intact.   4. Hemorrhagic shock: ruptured iliac artery w/ intra-abd bleed and shock. Sp covered stent per IR to common and ext iliac arteries.  5. H/o cervical Ca - treated 2014 , no evidence recurrence.  6. H/o cecal perf 2017 - had ostomy, sp takedown last yr 58. Chronic pain    P - as above.        Kelly Splinter MD [employer] pager [phone]   01/26/2018, 8:05 PM

## 2018-01-23 NOTE — Code Documentation (Addendum)
  Patient Name: Katherine Kramer   MRN: [id number]   Date of Birth/ Sex: 1970-05-05 , female      Admission Date: 02/11/2018  Attending Provider: Renee Pain, MD  Primary Diagnosis: Hemorrhagic shock [facility])   Indication: Pt was in her usual state of health until this AM, when she was noted to be PEA Arrest . Code blue was subsequently called. At the time of arrival on scene, ACLS protocol was underway.   Technical Description:  - CPR performance duration:  7 minutes  - Was defibrillation or cardioversion used? No   - Was external pacer placed? No  - Was patient intubated pre/post CPR? Yes   Medications Administered: Y = Yes; Blank = No Amiodarone    Atropine    Calcium    Epinephrine  Y  Lidocaine    Magnesium    Norepinephrine    Phenylephrine    Sodium bicarbonate  Y  Vasopressin     Post CPR evaluation:  - Final Status - Was patient successfully resuscitated ? Yes - What is current rhythm? Sinus tachycardia  - What is current hemodynamic status? Stable   Miscellaneous Information:  - Labs sent, including: No   - Primary team notified?  Yes  - Family Notified? Yes  - Additional notes/ transfer status: GI is planning EGD     Jean Rosenthal, MD  01/31/2018, 11:57 AM   I supervised ACLS protocol, again PEA arrest achieved ROSC after 7 minutes of CPR, epi x3, 2 units PRBC rapidly transfused, after discussion with family no CPR initiated  Rakesh V. Elsworth Soho MD

## 2018-01-23 NOTE — Progress Notes (Signed)
Pharmacy Antibiotic Note  Katherine Kramer is a 48 y.o. female admitted on 01/17/2018 with AMS, now w/ BRBPR and concern for pyelo and sepsis.  Pharmacy has been consulted for Vancocin and Merrem dosing.  Pt admitted w/ acute on chronic renal failure, baseline SCr ~3; pt seems to be approaching baseline but now critically ill.  Plan: Vancomycin 1g x1 and monitor SCr prior to redosing. Meropenem 1g IV x1 then 500mg  IV Q12H.  Height: 5\' 2"  (157.5 cm) Weight: 102 lb 11.8 oz (46.6 kg) IBW/kg (Calculated) : 50.1  Temp (24hrs), Avg:98.7 F (37.1 C), Min:98.3 F (36.8 C), Max:99.5 F (37.5 C)  Recent Labs  Lab 02/04/2018 2011 01/31/2018 2204 01/20/18 0329  01/21/18 0745 01/22/18 0417 01/22/18 1549 02/03/2018 0042 01/22/2018 0245  WBC  --   --  7.9   < > 8.6 7.4 9.5 14.4* 17.8*  CREATININE  --   --  4.78*  --  4.02* 3.78* 3.58* 3.24*  --   LATICACIDVEN 0.45* 0.75 0.4*  --   --   --   --   --   --    < > = values in this interval not displayed.    Estimated Creatinine Clearance: 16.1 mL/min (A) (by C-G formula based on SCr of 3.24 mg/dL (H)).    Allergies  Allergen Reactions  . Iodine Other (See Comments)    headache  . Iodinated Diagnostic Agents Other (See Comments)    Pt states the allergy was to IV dye not oral contrast  . Piperacillin-Tazobactam In Dex Other (See Comments)    headache     Thank you for allowing pharmacy to be a part of this patient's care.  Wynona Neat, PharmD, BCPS  02/03/2018 3:13 AM

## 2018-01-23 NOTE — Progress Notes (Signed)
[facility] Progress Note Patient Name: Areanna Gengler DOB: 1970/08/25 MRN: [id number]   Date of Service  02/10/2018  HPI/Events of Note  Hypoxia - Sat = 79% on 100% and PEEP = 16.   eICU Interventions  Will order: 1. Portable CXR STAT to r/o barotrauma. 2. Will notify ground team for urgent bedside evaluation.      Intervention Category Major Interventions: Hypoxemia - evaluation and management;Respiratory failure - evaluation and management  Lysle Dingwall 02/03/2018, 8:04 PM

## 2018-01-23 NOTE — Transfer of Care (Signed)
Immediate Anesthesia Transfer of Care Note  Patient: Katherine Kramer  Procedure(s) Performed: IR WITH ANESTHESIA/ACTIVE BLEEDING (N/A )  Patient Location: ICU  Anesthesia Type:General  Level of Consciousness: sedated, unresponsive and Patient remains intubated per anesthesia plan  Airway & Oxygen Therapy: Patient remains intubated per anesthesia plan and Patient placed on Ventilator (see vital sign flow sheet for setting)  Post-op Assessment: Report given to RN  Post vital signs: Reviewed and stable  Last Vitals:  Vitals Value Taken Time  BP    Temp    Pulse 88 02/08/2018  6:22 PM  Resp 30 01/29/2018  6:22 PM  SpO2 64 % 01/14/2018  6:22 PM  Vitals shown include unvalidated device data.  Last Pain:  Vitals:   01/17/2018 0741  TempSrc: Axillary  PainSc:          Complications: No apparent anesthesia complications

## 2018-01-23 NOTE — Progress Notes (Signed)
Pharmacy Antibiotic Note  Katherine Kramer is a 48 y.o. female admitted on 02/09/2018 with AMS, now w/ BRBPR and concern for pyelo and sepsis.  Pharmacy has been consulted for Vancocin and Merrem dosing.    The patient is starting CRRT this evening, will adjust antibiotics accordingly.   Plan: - Adjust Meropenem to 1g IV every 12 hours (to start on 1/13) - Will obtain a Vancomycin random on 1/13 AM and address additional doses at that time - no standing orders for now - Will continue to follow CRRT tolerance/duration, culture results, LOT, and antibiotic de-escalation plans   Height: 5\' 2"  (157.5 cm) Weight: 102 lb 11.8 oz (46.6 kg) IBW/kg (Calculated) : 50.1  Temp (24hrs), Avg:97.6 F (36.4 C), Min:96.7 F (35.9 C), Max:99.5 F (37.5 C)  Recent Labs  Lab 01/12/2018 2011 02/07/2018 2204 01/20/18 0329  02/06/2018 0042 01/30/2018 0245 01/31/2018 0859 02/05/2018 0900 01/16/2018 1128 02/01/2018 1407 02/05/2018 1854  WBC  --   --  7.9   < > 14.4* 17.8*  --  30.3* 37.3* 15.1* 3.6*  CREATININE  --   --  4.78*   < > 3.24* 2.97*  --  2.98*  --  2.67* 2.13*  LATICACIDVEN 0.45* 0.75 0.4*  --   --  4.6* 11.0*  --   --   --   --    < > = values in this interval not displayed.    Estimated Creatinine Clearance: 24.5 mL/min (A) (by C-G formula based on SCr of 2.13 mg/dL (H)).    Allergies  Allergen Reactions  . Iodine Other (See Comments)    headache  . Iodinated Diagnostic Agents Other (See Comments)    Pt states the allergy was to IV dye not oral contrast  . Piperacillin-Tazobactam In Dex Other (See Comments)    headache   Vanc 1/12>> Meropenem 1/12>> Flagyl 1/12>>  1/8 Blood - NGTD 1/12 MRSA - NEG  Thank you for allowing pharmacy to be a part of this patient's care.  Alycia Rossetti, PharmD, BCPS Clinical Pharmacist 01/16/2018 8:07 PM   **Pharmacist phone directory can now be found on [email or web address] ([email or web address]).  Listed under [place].

## 2018-01-23 NOTE — Progress Notes (Signed)
Chaplain rec'd Code Blue approx. 8:40 AM. Pt's sister was outside of room when chaplain came.  Pt's sister then called her husband who had just left and asked him to return to hospital.  Worried about her husband finding the room again, the chaplain went to meet pt's brother-in-law and escort him to room. Chaplain provided ministry of presence.  Will continue to be available as needed. Tamsen Snider Pager [phone]

## 2018-01-23 NOTE — Procedures (Signed)
Central Venous Catheter Insertion Procedure Note  Procedure: Insertion of Central Venous Catheter  Indications:  vascular access, hypovolemia, severe bleeding, need for frequent blood draws  Procedure Details  Informed consent was obtained for the procedure, including sedation.  Risks of lung perforation, hemorrhage, arrhythmia, and adverse drug reaction were discussed.   Maximum sterile technique was used including antiseptics, cap, gloves, gown, hand hygiene, mask and sheet.  Under sterile conditions the skin above the at base of throat, on the left subclavian vein was prepped with betadine and covered with a sterile drape. Local anesthesia was applied to the skin and subcutaneous tissues. A 22-gauge needle was used to identify the vein. An 18-gauge needle was then inserted into the vein. A guide wire was then passed easily through the catheter. There were occasional PVCs. The catheter was then withdrawn. A 7.0 French triple-lumen was then inserted into the vessel over the guide wire. The catheter was sutured into place.  Findings: There were no changes to vital signs. Catheter was flushed with 10 cc NS. Patient did tolerate procedure well.  Recommendations: CXR ordered to verify placement.  Renee Pain, MD Board Certified by the [employer], [place]

## 2018-01-23 NOTE — Consult Note (Signed)
Reason for Consult:bleeding Referring Physician: Dr Arturo Morton is an 48 y.o. female.  HPI: 33 yof who has chronic kidney disease with cervical scc dx in 4/14 s/p pelvic lad/oophorectomy ([facility] note), chemo and ebrt/brachytherapy completed 1/91 that was complicated by radiation proctitis and bilateral ureteral obstruction s/p bilateral perc nephs, s/p sigmoid perforation and hartmanns in 2017,  colostomy dilatation for ostomy retraction (09/2015), colostomy revision (01/2016) and Hartmann's closure with diverting loop ileostomy (06/2016) who presented 11/05/2017 for ileostomy takedown. She had been doing ok since then. She was then admitted for mental status changes.  She then was noted to have large amounts brbpr and was coded for 6 minutes in pea. She was resuscitated. She has received six units of blood and one of ffp at this point.  She then underwent flex sig that I have reviewed with Dr Michail Sermon.  She has clot and evidence proctitis and what appears to be old anastomosis. There is active bleeding but cannot really discern where the bleeding is coming from. He did not go much past what appears to be an old anastomosis. I cannot definitively say where she currently is bleeding from.  She had upper scope that ruled that out.  When I see her with Dr Elsworth Soho in the unit she is becoming hypotensive and tachycardic again.      Past Medical History:  Diagnosis Date  . Cervical cancer ([place])   . CKD (chronic kidney disease)     Past Surgical History:  Procedure Laterality Date  . COLECTOMY WITH COLOSTOMY CREATION/HARTMANN PROCEDURE  04/2015   [facility]  Sigmoid colon perforation.   . COLOSTOMY REVISION  09/2015   [facility].  Dilitation  . COLOSTOMY REVISION  01/2016   [facility]  . COLOSTOMY TAKEDOWN  06/2016   [facility]  colostomy takedown w diverting loop ileostomy  . ILEO LOOP COLOSTOMY CLOSURE  11/05/2017   [facility]  . ILEO LOOP DIVERSION  06/2016   [facility]  with colostomy takedown  . IR NEPHROSTOMY EXCHANGE LEFT   01/21/2018  . IR NEPHROSTOMY EXCHANGE RIGHT  01/21/2018  . NEPHROSTOMY TUBE PLACEMENT ([place] HX)  201?  Marland Kitchen PELVIC LYMPH NODE DISSECTION  05/2012   [facility]    Family History  Family history unknown: Yes    Social History:  reports that she has been smoking. She has never used smokeless tobacco. No history on file for alcohol and drug.  Allergies:  Allergies  Allergen Reactions  . Iodine Other (See Comments)    headache  . Iodinated Diagnostic Agents Other (See Comments)    Pt states the allergy was to IV dye not oral contrast  . Piperacillin-Tazobactam In Dex Other (See Comments)    headache    Medications: I have reviewed the patient's current medications.  Results for orders placed or performed during the hospital encounter of 01/18/2018 (from the past 48 hour(s))  Glucose, capillary     Status: None   Collection Time: 01/21/18  8:00 PM  Result Value Ref Range   Glucose-Capillary 91 70 - 99 mg/dL  Glucose, capillary     Status: None   Collection Time: 01/22/18  1:48 AM  Result Value Ref Range   Glucose-Capillary 94 70 - 99 mg/dL  Basic metabolic panel     Status: Abnormal   Collection Time: 01/22/18  4:17 AM  Result Value Ref Range   Sodium 143 135 - 145 mmol/L   Potassium 3.9 3.5 - 5.1 mmol/L    Comment: DELTA CHECK NOTED  Chloride 118 (H) 98 - 111 mmol/L   CO2 15 (L) 22 - 32 mmol/L   Glucose, Bld 101 (H) 70 - 99 mg/dL   BUN 58 (H) 6 - 20 mg/dL   Creatinine, Ser 3.78 (H) 0.44 - 1.00 mg/dL   Calcium 8.2 (L) 8.9 - 10.3 mg/dL   GFR calc non Af Amer 14 (L) >60 mL/min   GFR calc Af Amer 16 (L) >60 mL/min   Anion gap 10 5 - 15    Comment: Performed at [facility] [address], [place], [place] [address]  CBC     Status: Abnormal   Collection Time: 01/22/18  4:17 AM  Result Value Ref Range   WBC 7.4 4.0 - 10.5 K/uL   RBC 2.49 (L) 3.87 - 5.11 MIL/uL   Hemoglobin 7.4 (L) 12.0 - 15.0 g/dL   HCT 23.3 (L) 36.0 - 46.0 %   MCV 93.6 80.0 - 100.0 fL   MCH 29.7 26.0 -  34.0 pg   MCHC 31.8 30.0 - 36.0 g/dL   RDW 16.4 (H) 11.5 - 15.5 %   Platelets 616 (H) 150 - 400 K/uL   nRBC 0.0 0.0 - 0.2 %    Comment: Performed at [facility] [address] [place], [place] [address]  Glucose, capillary     Status: Abnormal   Collection Time: 01/22/18  8:26 AM  Result Value Ref Range   Glucose-Capillary 112 (H) 70 - 99 mg/dL  Type and screen [place]     Status: None (Preliminary result)   Collection Time: 01/22/18  9:04 AM  Result Value Ref Range   ABO/RH(D) A POS    Antibody Screen NEG    Sample Expiration 01/25/2018    Unit Number [id number]    Blood Component Type RED CELLS,LR    Unit division 00    Status of Unit ISSUED    Transfusion Status OK TO TRANSFUSE    Crossmatch Result Compatible    Unit Number [id number]    Blood Component Type RED CELLS,LR    Unit division 00    Status of Unit ISSUED    Transfusion Status OK TO TRANSFUSE    Crossmatch Result Compatible    Unit Number [id number]    Blood Component Type RBC LR PHER1    Unit division 00    Status of Unit ISSUED    Transfusion Status OK TO TRANSFUSE    Crossmatch Result Compatible    Unit Number [id number]    Blood Component Type RED CELLS,LR    Unit division 00    Status of Unit ISSUED    Transfusion Status OK TO TRANSFUSE    Crossmatch Result Compatible    Unit Number [id number]    Blood Component Type RED CELLS,LR    Unit division 00    Status of Unit ISSUED    Transfusion Status OK TO TRANSFUSE    Crossmatch Result Compatible    Unit Number [id number]    Blood Component Type RBC LR PHER2    Unit division 00    Status of Unit ISSUED    Transfusion Status OK TO TRANSFUSE    Crossmatch Result Compatible    Unit Number [id number]    Blood Component Type RBC LR PHER2    Unit division 00    Status of Unit ISSUED    Transfusion Status OK TO TRANSFUSE    Crossmatch Result Compatible    Unit Number [id number]  Blood Component  Type RBC LR PHER2    Unit division 00    Status of Unit ISSUED    Transfusion Status OK TO TRANSFUSE    Crossmatch Result Compatible    Unit Number [id number]    Blood Component Type RED CELLS,LR    Unit division 00    Status of Unit ISSUED    Transfusion Status OK TO TRANSFUSE    Crossmatch Result Compatible    Unit Number [id number]    Blood Component Type RBC LR PHER1    Unit division 00    Status of Unit ISSUED    Transfusion Status OK TO TRANSFUSE    Crossmatch Result Compatible    Unit Number [id number]    Blood Component Type RBC LR PHER2    Unit division 00    Status of Unit ISSUED    Transfusion Status OK TO TRANSFUSE    Crossmatch Result Compatible    Unit Number [id number]    Blood Component Type RED CELLS,LR    Unit division 00    Status of Unit ISSUED    Transfusion Status OK TO TRANSFUSE    Crossmatch Result Compatible    Unit Number [id number]    Blood Component Type RBC LR PHER1    Unit division 00    Status of Unit ALLOCATED    Transfusion Status OK TO TRANSFUSE    Crossmatch Result Compatible    Unit Number [id number]    Blood Component Type RBC LR PHER2    Unit division 00    Status of Unit ALLOCATED    Transfusion Status OK TO TRANSFUSE    Crossmatch Result Compatible    Unit Number [id number]    Blood Component Type RBC LR PHER1    Unit division 00    Status of Unit ISSUED    Transfusion Status OK TO TRANSFUSE    Crossmatch Result      Compatible Performed at [facility], [place] [address], [place], [place] [address]    Unit Number [id number]    Blood Component Type RBC LR PHER1    Unit division 00    Status of Unit ISSUED    Transfusion Status OK TO TRANSFUSE    Crossmatch Result Compatible   Basic metabolic panel     Status: Abnormal   Collection Time: 01/22/18  3:49 PM  Result Value Ref Range   Sodium 144 135 - 145 mmol/L   Potassium 3.6 3.5 - 5.1 mmol/L   Chloride 115 (H) 98 - 111 mmol/L   CO2 17 (L) 22  - 32 mmol/L   Glucose, Bld 105 (H) 70 - 99 mg/dL   BUN 56 (H) 6 - 20 mg/dL   Creatinine, Ser 3.58 (H) 0.44 - 1.00 mg/dL   Calcium 8.3 (L) 8.9 - 10.3 mg/dL   GFR calc non Af Amer 15 (L) >60 mL/min   GFR calc Af Amer 17 (L) >60 mL/min   Anion gap 12 5 - 15    Comment: Performed at [facility], [place] [address], [place] [address]  CBC     Status: Abnormal   Collection Time: 01/22/18  3:49 PM  Result Value Ref Range   WBC 9.5 4.0 - 10.5 K/uL   RBC 2.63 (L) 3.87 - 5.11 MIL/uL   Hemoglobin 7.6 (L) 12.0 - 15.0 g/dL   HCT 23.9 (L) 36.0 - 46.0 %   MCV 90.9 80.0 - 100.0 fL   MCH 28.9 26.0 - 34.0  pg   MCHC 31.8 30.0 - 36.0 g/dL   RDW 16.2 (H) 11.5 - 15.5 %   Platelets 625 (H) 150 - 400 K/uL   nRBC 0.0 0.0 - 0.2 %    Comment: Performed at [facility], [place] [address], [place], [place] [address]  Glucose, capillary     Status: None   Collection Time: 02/06/2018 12:23 AM  Result Value Ref Range   Glucose-Capillary 99 70 - 99 mg/dL  Basic metabolic panel     Status: Abnormal   Collection Time: 01/14/2018 12:42 AM  Result Value Ref Range   Sodium 143 135 - 145 mmol/L   Potassium 3.8 3.5 - 5.1 mmol/L   Chloride 118 (H) 98 - 111 mmol/L   CO2 15 (L) 22 - 32 mmol/L   Glucose, Bld 133 (H) 70 - 99 mg/dL   BUN 55 (H) 6 - 20 mg/dL   Creatinine, Ser 3.24 (H) 0.44 - 1.00 mg/dL   Calcium 7.6 (L) 8.9 - 10.3 mg/dL   GFR calc non Af Amer 16 (L) >60 mL/min   GFR calc Af Amer 19 (L) >60 mL/min   Anion gap 10 5 - 15    Comment: Performed at [facility] [address], [place] [address]  CBC     Status: Abnormal   Collection Time: 01/14/2018 12:42 AM  Result Value Ref Range   WBC 14.4 (H) 4.0 - 10.5 K/uL   RBC 2.08 (L) 3.87 - 5.11 MIL/uL   Hemoglobin 6.0 (LL) 12.0 - 15.0 g/dL    Comment: REPEATED TO VERIFY THIS CRITICAL RESULT HAS VERIFIED AND BEEN CALLED TO K.COBLE,RN BY GEOFFREY MCADOO ON 01 12 2020 AT 0055, AND HAS BEEN READ BACK.     HCT 19.4 (L) 36.0 - 46.0 %    MCV 93.3 80.0 - 100.0 fL   MCH 28.8 26.0 - 34.0 pg   MCHC 30.9 30.0 - 36.0 g/dL   RDW 16.3 (H) 11.5 - 15.5 %   Platelets 582 (H) 150 - 400 K/uL   nRBC 0.0 0.0 - 0.2 %    Comment: Performed at [facility] [address] [place], [place] [address]  Protime-INR     Status: Abnormal   Collection Time: 01/13/2018 12:42 AM  Result Value Ref Range   Prothrombin Time 16.5 (H) 11.4 - 15.2 seconds   INR 1.35     Comment: Performed at [facility] [address], [place], [place] [address]  APTT     Status: None   Collection Time: 02/04/2018 12:42 AM  Result Value Ref Range   aPTT 31 24 - 36 seconds    Comment: Performed at [facility] [address], [place], [place] [address]  Prepare RBC     Status: None   Collection Time: 01/14/2018 12:57 AM  Result Value Ref Range   Order Confirmation      ORDER PROCESSED BY BLOOD BANK Performed at [facility], [place] [address], [place], [place] [address]   Prepare fresh frozen plasma     Status: None (Preliminary result)   Collection Time: 02/08/2018  1:02 AM  Result Value Ref Range   Unit Number [id number]    Blood Component Type THAWED PLASMA    Unit division 00    Status of Unit ISSUED    Transfusion Status OK TO TRANSFUSE    Unit Number [id number]    Blood Component Type THAWED PLASMA  Unit division 00    Status of Unit ISSUED    Transfusion Status OK TO TRANSFUSE    Unit Number [id number]    Blood Component Type THAWED PLASMA    Unit division 00    Status of Unit ISSUED    Transfusion Status      OK TO TRANSFUSE Performed at [facility] [address] [place], [place] [address]    Unit Number [id number]    Blood Component Type THAWED PLASMA    Unit division 00    Status of Unit ISSUED    Transfusion Status OK TO TRANSFUSE   MRSA PCR Screening     Status: None   Collection Time: 01/13/2018  1:17 AM  Result Value Ref Range   MRSA by PCR NEGATIVE NEGATIVE    Comment:        The GeneXpert  MRSA Assay (FDA approved for NASAL specimens only), is one component of a comprehensive MRSA colonization surveillance program. It is not intended to diagnose MRSA infection nor to guide or monitor treatment for MRSA infections. Performed at [facility], [place] [address], [place], [place] [address]   I-STAT 3, venous blood gas (G3P V)     Status: Abnormal   Collection Time: 02/06/2018  2:03 AM  Result Value Ref Range   pH, Ven 7.087 (LL) 7.250 - 7.430   pCO2, Ven 24.3 (L) 44.0 - 60.0 mmHg   pO2, Ven 93.0 (H) 32.0 - 45.0 mmHg   Bicarbonate 7.3 (L) 20.0 - 28.0 mmol/L   TCO2 8 (L) 22 - 32 mmol/L   O2 Saturation 93.0 %   Acid-base deficit 20.0 (H) 0.0 - 2.0 mmol/L   Patient temperature 99.5 F    Collection site [employer] type VENOUS    Comment NOTIFIED PHYSICIAN   I-STAT 3, arterial blood gas (G3+)     Status: Abnormal   Collection Time: 01/25/2018  2:16 AM  Result Value Ref Range   pH, Arterial 7.328 (L) 7.350 - 7.450   pCO2 arterial 27.6 (L) 32.0 - 48.0 mmHg   pO2, Arterial 450.0 (H) 83.0 - 108.0 mmHg   Bicarbonate 14.5 (L) 20.0 - 28.0 mmol/L   TCO2 15 (L) 22 - 32 mmol/L   O2 Saturation 100.0 %   Acid-base deficit 11.0 (H) 0.0 - 2.0 mmol/L   Patient temperature 99.0 F    Collection site RADIAL, ALLEN'S TEST ACCEPTABLE    Sample type ARTERIAL   Glucose, capillary     Status: Abnormal   Collection Time: 02/04/2018  2:40 AM  Result Value Ref Range   Glucose-Capillary 248 (H) 70 - 99 mg/dL   Comment 1 Notify RN   Triglycerides     Status: None   Collection Time: 01/18/2018  2:45 AM  Result Value Ref Range   Triglycerides 49 <150 mg/dL    Comment: Performed at [facility], [place] [address], [place], [place] [address]  Lactic acid, plasma     Status: Abnormal   Collection Time: 02/05/2018  2:45 AM  Result Value Ref Range   Lactic Acid, Venous 4.6 (HH) 0.5 - 1.9 mmol/L    Comment: CRITICAL RESULT CALLED TO, READ BACK BY AND VERIFIED WITH: LUCK,C RN 001/17/202019  0359 JORDANS CORECTED NAME HAYES,C RN CORRECTED DATE 01/23/2018 Performed at [facility], [place] [address] [place], [place] [address] CORRECTED ON 01/12 AT 3664: PREVIOUSLY REPORTED AS 4.6 CRITICAL RESULT CALLED TO, READ BACK BY AND  VERIFIED WITH: LUCK,C RN 001/20/202019 0359 JORDANS   Brain natriuretic peptide     Status: Abnormal   Collection Time: 02/11/2018  2:45 AM  Result Value Ref Range   B Natriuretic Peptide 337.2 (H) 0.0 - 100.0 pg/mL    Comment: Performed at [facility] [address] [place], [place] [address]  CBC     Status: Abnormal   Collection Time: 02/09/2018  2:45 AM  Result Value Ref Range   WBC 17.8 (H) 4.0 - 10.5 K/uL   RBC 2.34 (L) 3.87 - 5.11 MIL/uL   Hemoglobin 7.1 (L) 12.0 - 15.0 g/dL   HCT 22.7 (L) 36.0 - 46.0 %   MCV 97.0 80.0 - 100.0 fL   MCH 30.3 26.0 - 34.0 pg   MCHC 31.3 30.0 - 36.0 g/dL   RDW 13.9 11.5 - 15.5 %   Platelets 234 150 - 400 K/uL    Comment: REPEATED TO VERIFY DELTA CHECK NOTED    nRBC 0.2 0.0 - 0.2 %    Comment: Performed at [facility], [place] [address] [place], [place] [address]  Comprehensive metabolic panel     Status: Abnormal   Collection Time: 01/22/2018  2:45 AM  Result Value Ref Range   Sodium 145 135 - 145 mmol/L   Potassium 3.4 (L) 3.5 - 5.1 mmol/L   Chloride 119 (H) 98 - 111 mmol/L   CO2 13 (L) 22 - 32 mmol/L   Glucose, Bld 260 (H) 70 - 99 mg/dL   BUN 51 (H) 6 - 20 mg/dL   Creatinine, Ser 2.97 (H) 0.44 - 1.00 mg/dL   Calcium 6.4 (LL) 8.9 - 10.3 mg/dL    Comment: CRITICAL RESULT CALLED TO, READ BACK BY AND VERIFIED WITH: HAYES,C RN 01/22/2018 0404 JORDANS    Total Protein 3.6 (L) 6.5 - 8.1 g/dL   Albumin 1.6 (L) 3.5 - 5.0 g/dL   AST 18 15 - 41 U/L   ALT 8 0 - 44 U/L   Alkaline Phosphatase 29 (L) 38 - 126 U/L   Total Bilirubin 0.4 0.3 - 1.2 mg/dL   GFR calc non Af Amer 18 (L) >60 mL/min   GFR calc Af Amer 21 (L) >60 mL/min   Anion gap 13 5 - 15    Comment: Performed at [facility], [place] [address] [place], [place] [address]  Procalcitonin - Baseline     Status: None   Collection Time: 01/13/2018  2:45 AM  Result Value Ref Range   Procalcitonin 0.24 ng/mL    Comment:        Interpretation: PCT (Procalcitonin) <= 0.5 ng/mL: Systemic infection (sepsis) is not likely. Local bacterial infection is possible. (NOTE)       Sepsis PCT Algorithm           Lower Respiratory Tract                                      Infection PCT Algorithm    ----------------------------     ----------------------------         PCT < 0.25 ng/mL                PCT < 0.10 ng/mL         Strongly encourage             Strongly discourage   discontinuation of antibiotics  initiation of antibiotics    ----------------------------     -----------------------------       PCT 0.25 - 0.50 ng/mL            PCT 0.10 - 0.25 ng/mL               OR       >80% decrease in PCT            Discourage initiation of                                            antibiotics      Encourage discontinuation           of antibiotics    ----------------------------     -----------------------------         PCT >= 0.50 ng/mL              PCT 0.26 - 0.50 ng/mL               AND        <80% decrease in PCT             Encourage initiation of                                             antibiotics       Encourage continuation           of antibiotics    ----------------------------     -----------------------------        PCT >= 0.50 ng/mL                  PCT > 0.50 ng/mL               AND         increase in PCT                  Strongly encourage                                      initiation of antibiotics    Strongly encourage escalation           of antibiotics                                     -----------------------------                                           PCT <= 0.25 ng/mL                                                 OR                                        >  80% decrease in PCT                                      Discontinue / Do not initiate                                             antibiotics Performed at [facility], [place] [address], [place], [place] [address]   Troponin I - Once     Status: Abnormal   Collection Time: 02/09/2018  2:45 AM  Result Value Ref Range   Troponin I 0.05 (HH) <0.03 ng/mL    Comment: CRITICAL RESULT CALLED TO, READ BACK BY AND VERIFIED WITH: HAYES,C RN 01/15/2018 0404 JORDans Performed at [facility], [place] [address], [place], [place] [address]   Glucose, capillary     Status: Abnormal   Collection Time: 02/01/2018  4:02 AM  Result Value Ref Range   Glucose-Capillary 203 (H) 70 - 99 mg/dL   Comment 1 Notify RN   Prepare RBC     Status: None   Collection Time: 02/02/2018  4:06 AM  Result Value Ref Range   Order Confirmation      ORDER PROCESSED BY BLOOD BANK Performed at [facility], [place] [address], [place], [place] [address]   I-STAT 3, arterial blood gas (G3+)     Status: Abnormal   Collection Time: 01/13/2018  4:52 AM  Result Value Ref Range   pH, Arterial 7.140 (LL) 7.350 - 7.450   pCO2 arterial 30.2 (L) 32.0 - 48.0 mmHg   pO2, Arterial 40.0 (LL) 83.0 - 108.0 mmHg   Bicarbonate 10.3 (L) 20.0 - 28.0 mmol/L   TCO2 11 (L) 22 - 32 mmol/L   O2 Saturation 61.0 %   Acid-base deficit 17.0 (H) 0.0 - 2.0 mmol/L   Patient temperature 98.2 F    Collection site RADIAL, ALLEN'S TEST ACCEPTABLE    Drawn by Operator    Sample type ARTERIAL   I-STAT 3, arterial blood gas (G3+)     Status: Abnormal   Collection Time: 01/14/2018  6:23 AM  Result Value Ref Range   pH, Arterial 7.325 (L) 7.350 - 7.450   pCO2 arterial 21.1 (L) 32.0 - 48.0 mmHg   pO2, Arterial 309.0 (H) 83.0 - 108.0 mmHg   Bicarbonate 11.1 (L) 20.0 - 28.0 mmol/L   TCO2 12 (L) 22 - 32 mmol/L   O2 Saturation 100.0 %   Acid-base deficit 14.0 (H) 0.0 - 2.0 mmol/L   Patient temperature 97.0 F    Collection site RADIAL, ALLEN'S TEST ACCEPTABLE    Drawn by Operator    Sample type  ARTERIAL   Glucose, capillary     Status: Abnormal   Collection Time: 02/11/2018  7:39 AM  Result Value Ref Range   Glucose-Capillary 217 (H) 70 - 99 mg/dL  Prepare RBC     Status: None   Collection Time: 02/05/2018  8:54 AM  Result Value Ref Range   Order Confirmation      ORDER PROCESSED BY BLOOD BANK Performed at [facility], [address] [place], [place] [address]   Lactic acid, plasma     Status: Abnormal   Collection Time: 02/07/2018  8:59 AM  Result Value Ref Range   Lactic  Acid, Venous 11.0 (HH) 0.5 - 1.9 mmol/L    Comment: RESULTS CONFIRMED BY MANUAL DILUTION CRITICAL RESULT CALLED TO, READ BACK BY AND VERIFIED WITH: A.[facility] @ 1008 01/26/2018 [place] Performed at [facility], [place] [address] [place], [place] [address]   Comprehensive metabolic panel     Status: Abnormal   Collection Time: 01/16/2018  9:00 AM  Result Value Ref Range   Sodium 150 (H) 135 - 145 mmol/L   Potassium 4.5 3.5 - 5.1 mmol/L    Comment: NO VISIBLE HEMOLYSIS   Chloride 128 (H) 98 - 111 mmol/L   CO2 <7 (L) 22 - 32 mmol/L    Comment: REPEATED TO VERIFY   Glucose, Bld 157 (H) 70 - 99 mg/dL   BUN 52 (H) 6 - 20 mg/dL   Creatinine, Ser 2.98 (H) 0.44 - 1.00 mg/dL   Calcium 7.4 (L) 8.9 - 10.3 mg/dL   Total Protein <3.0 (L) 6.5 - 8.1 g/dL    Comment: REPEATED TO VERIFY   Albumin 1.3 (L) 3.5 - 5.0 g/dL   AST 28 15 - 41 U/L   ALT 10 0 - 44 U/L   Alkaline Phosphatase 37 (L) 38 - 126 U/L   Total Bilirubin 0.6 0.3 - 1.2 mg/dL   GFR calc non Af Amer 18 (L) >60 mL/min   GFR calc Af Amer 21 (L) >60 mL/min   Anion gap NOT CALCULATED 5 - 15    Comment: Performed at [facility], [place] [address] [place], [place] [address]  CBC     Status: Abnormal   Collection Time: 02/02/2018  9:00 AM  Result Value Ref Range   WBC 30.3 (H) 4.0 - 10.5 K/uL   RBC 2.22 (L) 3.87 - 5.11 MIL/uL   Hemoglobin 6.6 (LL) 12.0 - 15.0 g/dL    Comment: REPEATED TO VERIFY THIS CRITICAL RESULT HAS VERIFIED AND BEEN  CALLED TO RN A CLARK BY LESLIE BENFIELD ON 01 12 2020 AT 0918, AND HAS BEEN READ BACK.     HCT 22.0 (L) 36.0 - 46.0 %   MCV 99.1 80.0 - 100.0 fL   MCH 29.7 26.0 - 34.0 pg   MCHC 30.0 30.0 - 36.0 g/dL   RDW 14.3 11.5 - 15.5 %   Platelets 147 (L) 150 - 400 K/uL   nRBC 0.5 (H) 0.0 - 0.2 %    Comment: Performed at [facility] [address], [place], [place] [address]  Troponin I -     Status: Abnormal   Collection Time: 01/16/2018  9:00 AM  Result Value Ref Range   Troponin I 0.30 (HH) <0.03 ng/mL    Comment: CRITICAL VALUE NOTED.  VALUE IS CONSISTENT WITH PREVIOUSLY REPORTED AND CALLED VALUE. Performed at [facility], [place] [address] [place],  [address]   CBC     Status: Abnormal   Collection Time: 01/22/2018 11:28 AM  Result Value Ref Range   WBC 37.3 (H) 4.0 - 10.5 K/uL   RBC 4.58 3.87 - 5.11 MIL/uL   Hemoglobin 13.6 12.0 - 15.0 g/dL    Comment: REPEATED TO VERIFY POST TRANSFUSION SPECIMEN    HCT 40.5 36.0 - 46.0 %   MCV 88.4 80.0 - 100.0 fL    Comment: POST TRANSFUSION SPECIMEN REPEATED TO VERIFY    MCH 29.7 26.0 - 34.0 pg   MCHC 33.6 30.0 - 36.0 g/dL   RDW 14.1 11.5 - 15.5 %   Platelets 145 (L) 150 -  400 K/uL   nRBC 0.2 0.0 - 0.2 %    Comment: Performed at [facility], [place] [address] [place], [place] [address]  I-STAT 3, arterial blood gas (G3+)     Status: Abnormal   Collection Time: 02/03/2018 11:28 AM  Result Value Ref Range   pH, Arterial 7.205 (L) 7.350 - 7.450   pCO2 arterial 24.5 (L) 32.0 - 48.0 mmHg   pO2, Arterial 527.0 (H) 83.0 - 108.0 mmHg   Bicarbonate 9.8 (L) 20.0 - 28.0 mmol/L   TCO2 11 (L) 22 - 32 mmol/L   O2 Saturation 100.0 %   Acid-base deficit 17.0 (H) 0.0 - 2.0 mmol/L   Patient temperature 96.7 F    Sample type ARTERIAL   Hemoglobin and hematocrit, blood     Status: Abnormal   Collection Time: 01/27/2018 11:57 AM  Result Value Ref Range   Hemoglobin 10.0 (L) 12.0 - 15.0 g/dL    Comment: REPEATED TO VERIFY   HCT 31.6 (L)  36.0 - 46.0 %    Comment: Performed at [facility] [address] [place], [place] [address]  Prepare RBC     Status: None   Collection Time: 01/17/2018 12:10 PM  Result Value Ref Range   Order Confirmation      ORDER PROCESSED BY BLOOD BANK Performed at [facility], [place] [address], [place], [place] [address]   Prepare RBC     Status: None   Collection Time: 02/09/2018 12:34 PM  Result Value Ref Range   Order Confirmation      ORDER PROCESSED BY BLOOD BANK Performed at [facility], [place] [address] [place], [place] [address]   Troponin I - Now Then Q6H     Status: Abnormal   Collection Time: 02/10/2018  2:07 PM  Result Value Ref Range   Troponin I 0.28 (HH) <0.03 ng/mL    Comment: CRITICAL VALUE NOTED.  VALUE IS CONSISTENT WITH PREVIOUSLY REPORTED AND CALLED VALUE. Performed at [facility], [place] [address] [place], [place] [address]   CBC     Status: Abnormal   Collection Time: 01/21/2018  2:07 PM  Result Value Ref Range   WBC 15.1 (H) 4.0 - 10.5 K/uL   RBC 4.89 3.87 - 5.11 MIL/uL   Hemoglobin 14.5 12.0 - 15.0 g/dL    Comment: REPEATED TO VERIFY POST TRANSFUSION SPECIMEN    HCT 42.5 36.0 - 46.0 %   MCV 86.9 80.0 - 100.0 fL   MCH 29.7 26.0 - 34.0 pg   MCHC 34.1 30.0 - 36.0 g/dL   RDW 13.2 11.5 - 15.5 %   Platelets 36 (L) 150 - 400 K/uL    Comment: Immature Platelet Fraction may be clinically indicated, consider ordering this additional test [id number]    nRBC 0.1 0.0 - 0.2 %    Comment: Performed at [facility], [place] [address] [place], [place] [address]  Basic metabolic panel     Status: Abnormal   Collection Time: 02/03/2018  2:07 PM  Result Value Ref Range   Sodium 147 (H) 135 - 145 mmol/L   Potassium 2.7 (LL) 3.5 - 5.1 mmol/L    Comment: CRITICAL RESULT CALLED TO, READ BACK BY AND VERIFIED WITH: A CLARK,RN AT 1508 02/03/2018 BY L BENFIELD    Chloride 122 (H) 98 - 111 mmol/L   CO2 10 (L) 22 - 32 mmol/L   Glucose, Bld 274 (H) 70 - 99 mg/dL  BUN 53 (H) 6 - 20 mg/dL   Creatinine, Ser 2.67 (H) 0.44 - 1.00 mg/dL   Calcium 5.8 (LL) 8.9 - 10.3 mg/dL    Comment: CRITICAL RESULT CALLED TO, READ BACK BY AND VERIFIED WITH: A CLARK,RN AT 1508 01/18/2018 BY L BENFIELD    GFR calc non Af Amer 21 (L) >60 mL/min   GFR calc Af Amer 24 (L) >60 mL/min   Anion gap 15 5 - 15    Comment: Performed at [facility] [address] [place], [place] [address]  Protime-INR     Status: Abnormal   Collection Time: 02/06/2018  2:07 PM  Result Value Ref Range   Prothrombin Time 19.5 (H) 11.4 - 15.2 seconds   INR 1.67     Comment: Performed at [facility] [address], [place], [place] [address]  Prepare fresh frozen plasma     Status: None (Preliminary result)   Collection Time: 02/04/2018  2:36 PM  Result Value Ref Range   Unit Number [id number]    Blood Component Type THAWED PLASMA    Unit division 00    Status of Unit ISSUED    Transfusion Status OK TO TRANSFUSE    Unit Number [id number]    Blood Component Type THAWED PLASMA    Unit division 00    Status of Unit ISSUED    Transfusion Status      OK TO TRANSFUSE Performed at [facility] [address], [place], [place] [address]    Unit Number [id number]    Blood Component Type THAWED PLASMA    Unit division 00    Status of Unit ISSUED    Transfusion Status OK TO TRANSFUSE    Unit Number [id number]    Blood Component Type THAWED PLASMA    Unit division 00    Status of Unit ISSUED    Transfusion Status OK TO TRANSFUSE   Prepare RBC     Status: None   Collection Time: 01/20/2018  2:37 PM  Result Value Ref Range   Order Confirmation      ORDER PROCESSED BY BLOOD BANK Performed at [facility], [place] [address] [place], [place] [address]   Prepare RBC     Status: None   Collection Time: 02/01/2018  3:00 PM  Result Value Ref Range   Order Confirmation      ORDER PROCESSED BY BLOOD BANK ALREADY HAVE PICKED UP 2 COOLERS WITH 2RCLR AND 4 FFP IN ONE,  AND 2RCLR IN ONE. Performed at [facility], [place] [address], [place], [place] [address]     Dg Chest Port 1 View  Result Date: 01/26/2018 CLINICAL DATA:  Patient status post intubation EXAM: PORTABLE CHEST 1 VIEW COMPARISON:  Chest radiograph 01/28/2018 FINDINGS: ET tube terminates in the mid trachea. Left subclavian central venous catheter tip projects over the superior vena cava. Monitoring leads overlie the patient. Enteric tube courses inferior to the diaphragm. Pacer pads overlie the patient. Low lung volumes. No large area pulmonary consolidation. No pleural effusion or pneumothorax. IMPRESSION: Support apparatus as above. No consolidative pulmonary opacities. Electronically Signed   By: Lovey Newcomer M.D.   On: 01/25/2018 09:54   Dg Chest Port 1 View  Result Date: 02/05/2018 CLINICAL DATA:  Central line placement EXAM: PORTABLE CHEST 1 VIEW COMPARISON:  01/31/2018 FINDINGS: Left-sided central venous catheter tip over the cavoatrial region. Endotracheal tube tip is 3.7 cm superior to the carina. Lungs are clear.  Heart size is normal. No pneumothorax. IMPRESSION: Left-sided central venous catheter tip overlies the cavoatrial region. No pneumothorax. Endotracheal tube tip about 3.7 cm superior to the carina. Electronically Signed   By: Donavan Foil M.D.   On: 01/30/2018 02:24   Dg Abd Portable 1v  Result Date: 02/07/2018 CLINICAL DATA:  NG tube placement EXAM: PORTABLE ABDOMEN - 1 VIEW COMPARISON:  01/21/2018 FINDINGS: Enteric tube tip is in the left upper quadrant consistent with location in the body of the stomach. Bilateral nephrostomy tubes and ureteral stents are demonstrated. Gas-filled small and large bowel without significant distention, likely ileus. IMPRESSION: Enteric tube tip is in the left upper quadrant consistent with location in the body of the stomach. Electronically Signed   By: Lucienne Capers M.D.   On: 02/03/2018 02:35    Review of Systems  Unable to perform ROS:  Patient unresponsive   Blood pressure (!) 158/107, pulse 100, temperature (!) 96.7 F (35.9 C), temperature source Axillary, resp. rate (!) 33, height 5\' 2"  (1.575 m), weight 46.6 kg, SpO2 100 %. Physical Exam  Constitutional: She appears ill. She is intubated.  Cardiovascular: Tachycardia present.  Respiratory: She is intubated.  GI: Bowel sounds are decreased. No hernia.    Abdomen mild distended, she has multiple healed scars   Neurological: She is unresponsive.    Assessment/Plan: LGI bleed  She has a life threatening bleed she has coded from 3 times at this point. I am not entirely positive where bleed is from although looks rectal in nature. Scope did not go any more proximal.  This clinically fits as well.   It would be odd for this to be anastomotic this far out.  I have discussed with Dr Elsworth Soho. I think she needs mtp, larger access and resuscitation in 1:1 fashion.  Operatively the only option would be to resect this. I think that would be very difficult given her history though and would need to scope her on table as well to figure this out.  This certainly is a possibility but I think quicker and safer for her to attempt to localize and treat this with interventional radiology. The downside of this is the contrast load but I think her kidneys have already likely taken an injury with hypotension. I think the upside of no surgery outweighs the dye load.  I will await these results.  I have discussed this plan with her family who are in agreement.    Rolm Bookbinder 01/30/2018, 3:12 PM

## 2018-01-23 NOTE — Anesthesia Preprocedure Evaluation (Addendum)
Anesthesia Evaluation  Patient identified by MRN, date of birth, ID band Patient unresponsive    Reviewed: Allergy & Precautions, NPO status , Patient's Chart, lab work & pertinent test results  Airway Mallampati: Intubated       Dental   Pulmonary Current Smoker,    + rhonchi        Cardiovascular negative cardio ROS   Rhythm:Regular Rate:Tachycardia     Neuro/Psych PSYCHIATRIC DISORDERS Anxiety Depression negative neurological ROS     GI/Hepatic negative GI ROS, Neg liver ROS,   Endo/Other  negative endocrine ROS  Renal/GU Renal disease   Cervical Cancer negative genitourinary   Musculoskeletal negative musculoskeletal ROS (+)   Abdominal   Peds negative pediatric ROS (+)  Hematology negative hematology ROS (+)   Anesthesia Other Findings   Reproductive/Obstetrics negative OB ROS                            Anesthesia Physical Anesthesia Plan  ASA: IV and emergent  Anesthesia Plan: General   Post-op Pain Management:    Induction:   PONV Risk Score and Plan:   Airway Management Planned: Oral ETT  Additional Equipment:   Intra-op Plan:   Post-operative Plan: Post-operative intubation/ventilation  Informed Consent: I have reviewed the patients History and Physical, chart, labs and discussed the procedure including the risks, benefits and alternatives for the proposed anesthesia with the patient or authorized representative who has indicated his/her understanding and acceptance.     Plan Discussed with: Anesthesiologist, CRNA and Surgeon  Anesthesia Plan Comments:         Anesthesia Quick Evaluation

## 2018-01-23 NOTE — Brief Op Note (Addendum)
Small proximal gastric ulcer with an adherent clot likely due to OG trauma. No blood seen in stomach or examined duodenum.  Large amount of fresh blood and clots in proximal rectum and distal sigmoid colon question due to radiation proctitis or anastomosis. Bleeding not amenable to endoscopic cautery. Would recommend surgical evaluation and interventional radiology evaluation to see if bleeding can be stopped. Continue aggressive volume resuscitation.

## 2018-01-23 NOTE — Significant Event (Addendum)
RN called NP around MN to say that pt had a "large" BM which was all BRBPR. Staff returned her to bed and her HR was up into the 120-160 range, ST. BP 120s. NP ordered labs and went to the bedside. By the time NP arrived, pt had bled BRBPR in the bed which was a large amount. BP down to 100s, HR still 160s, ST confimed by EKG. NP ordered PRBCs stat and 1L bolus. S: states this is the first time she has ever bled from her rectum. Last colonoscopy at [facility] in [place], [place] 6 months ago. States they told her it was clear and to come back in 10 years. NP specifically asked her if they mentioned diverticulosis and she replied no. She does not take ASA or any other NSAIDS at home routinely. She feels nauseated and is having cramps in her lower abdomen. No vomiting. She also feels weak and dizzy.  O: Acutely ill appearing WF in NAD. BP 100-120s. HR 120-160. RR 16 O2 sat 2L is 98%. She is alert and oriented and speaks fluently and answers questions appropriately in the beginning of the exam. + Pallor. Skin is warm and dry. Later, she acutely was lethargic but still knew her name and that she was in the hospital. BRBPR noted on the pad in the bed. Card: regular rhythm, ST. Lungs: CTA. Abd: BS + x4 quadrants. Soft. Grimaces when palpated in the RLQ. MOE x 4. No focal neuro deficits noted.  A/P: 1. Acute GIB with hemorrhagic shock. Likely lower bleed or upper hemorrhage but pt has had no hematemesis. Hgb now 6 down from 7.6. This was taken just after the 1st bleeding episode, so NP feels like it is lower now. Protonix drip started. 2L bolus and PRBC x 2 ordered emergently, stay 2 ahead. FFP ordered as well. PT/INR normal. ? where this bleed is coming from given her stated hx of no diverticulosis. No liver disease hx in the chart.  We moved emergently to ICU after discussing case with PCCM. Upon arrival to ICU, pt was more lethargic with a BP in the 40-50 range. Respirations shallow and she is basically unresponsive. Pt  emergently intubated by PCCM Dr. Janice Coffin lost her pulse, so no compressions.  Last BP up to 120s, HR back to 120-130s.  NP spoke to GI on call. Discussed case. He agreed to plan at present, mentioned a tag study may need to be done when she stabilizes. GI thought this could be upper GIB and may do an endoscopy when stable. GI will see the pt in am.  PCCM assumed care of pt. See the consult note.   Total critical care time: start 0010. End 0230. Total time 2 hrs 20 mins.  Critical care time was exclusive of separately billable procedures and treating other patients. Critical care was necessary to treat or prevent imminent or life-threatening deterioration. Critical care was time spent personally by me on the following activities: development of treatment plan with patient and/or surrogate as well as nursing, discussions with consultants, evaluation of patient's response to treatment, examination of patient, obtaining history from patient or surrogate, ordering and performing treatments and interventions, ordering and review of laboratory studies, ordering and review of radiographic studies, pulse oximetry and re-evaluation of patient's condition. [facility], NP [facility]

## 2018-01-23 NOTE — Progress Notes (Signed)
Critical ABG values RBV by Dr. Nathanial Rancher at 1825 on 01/14/2018 by Otho Ket, RRT. The patient's PEEP was increased to 16.

## 2018-01-23 NOTE — Progress Notes (Signed)
Patient was transported to IR and back. The patient's peak pressures are in the mid to high 40's. The patient's VT's have decreased to 150-250. The patient's VT was decreased to 300 and her RR was increased to 30. Dr. Nathanial Rancher was notified of this and an ABG was ordered for 10 min from now. Will continue to monitor.

## 2018-01-23 NOTE — Progress Notes (Signed)
eLink Physician-Brief Progress Note Patient Name: Katherine Kramer DOB: 17-Jul-1970 MRN: [id number]   Date of Service  01/30/2018  HPI/Events of Note  Multiple issues: 1. Hypotension - BP = 94/76 on Phenylephrine IV infusion at 300 mcg/min. CVP 11 and Hgb = 7.1, 2. Troponin = 0.05 - Demand Ischemia? And 3. Ca++ = 6.4  eICU Interventions  Will order: 1. Transfuse 1 unit PRBC now.  2. Continue to trend Troponin.  3. Replace Ca++.     Intervention Category Major Interventions: Hemorrhage - evaluation and management  Sommer,Steven Eugene 01/25/2018, 4:07 AM

## 2018-01-23 NOTE — Procedures (Signed)
Interventional Radiology Procedure Note  Procedure:  1.) IMA arteriogram 2.) RLE arteriogram 3.) Covered stent repair of ruptured right common and external iliac arteries  Complications: None  Estimated Blood Loss: Less than 25 mL  Recommendations: - Leg straight x 4 hrs  Signed,  Criselda Peaches, MD

## 2018-01-24 ENCOUNTER — Encounter (HOSPITAL_COMMUNITY): Payer: Self-pay | Admitting: Interventional Radiology

## 2018-01-24 ENCOUNTER — Inpatient Hospital Stay (HOSPITAL_COMMUNITY): Payer: PPO

## 2018-01-24 DIAGNOSIS — D696 Thrombocytopenia, unspecified: Secondary | ICD-10-CM | POA: Diagnosis present

## 2018-01-24 LAB — PREPARE FRESH FROZEN PLASMA
UNIT DIVISION: 0
UNIT DIVISION: 0
UNIT DIVISION: 0
Unit division: 0
Unit division: 0
Unit division: 0
Unit division: 0
Unit division: 0
Unit division: 0
Unit division: 0
Unit division: 0
Unit division: 0
Unit division: 0
Unit division: 0
Unit division: 0
Unit division: 0
Unit division: 0
Unit division: 0
Unit division: 0
Unit division: 0
Unit division: 0

## 2018-01-24 LAB — BPAM RBC
BLOOD PRODUCT EXPIRATION DATE: 202001282359
BLOOD PRODUCT EXPIRATION DATE: 202002062359
Blood Product Expiration Date: 202002032359
Blood Product Expiration Date: 202002032359
Blood Product Expiration Date: 202002032359
Blood Product Expiration Date: 202002032359
Blood Product Expiration Date: 202002042359
Blood Product Expiration Date: 202002042359
Blood Product Expiration Date: 202002042359
Blood Product Expiration Date: 202002042359
Blood Product Expiration Date: 202002042359
Blood Product Expiration Date: 202002042359
Blood Product Expiration Date: 202002042359
Blood Product Expiration Date: 202002042359
Blood Product Expiration Date: 202002042359
Blood Product Expiration Date: 202002042359
Blood Product Expiration Date: 202002042359
Blood Product Expiration Date: 202002042359
Blood Product Expiration Date: 202002042359
Blood Product Expiration Date: 202002062359
Blood Product Expiration Date: 202002062359
Blood Product Expiration Date: 202002062359
Blood Product Expiration Date: 202002072359
Blood Product Expiration Date: 202002072359
Blood Product Expiration Date: 202002072359
Blood Product Expiration Date: 202002072359
Blood Product Expiration Date: 202002082359
Blood Product Expiration Date: 202002082359
Blood Product Expiration Date: 202002082359
Blood Product Expiration Date: 202002082359
Blood Product Expiration Date: 202002082359
Blood Product Expiration Date: 202002092359
ISSUE DATE / TIME: 202001120049
ISSUE DATE / TIME: 202001120049
ISSUE DATE / TIME: 202001120433
ISSUE DATE / TIME: 202001120837
ISSUE DATE / TIME: 202001120837
ISSUE DATE / TIME: 202001121136
ISSUE DATE / TIME: 202001121136
ISSUE DATE / TIME: 202001121141
ISSUE DATE / TIME: 202001121141
ISSUE DATE / TIME: 202001121232
ISSUE DATE / TIME: 202001121430
ISSUE DATE / TIME: 202001121430
ISSUE DATE / TIME: 202001121444
ISSUE DATE / TIME: 202001121444
ISSUE DATE / TIME: 202001121556
ISSUE DATE / TIME: 202001121556
ISSUE DATE / TIME: 202001121633
ISSUE DATE / TIME: 202001121633
ISSUE DATE / TIME: 202001121633
ISSUE DATE / TIME: 202001121633
ISSUE DATE / TIME: 202001121701
ISSUE DATE / TIME: 202001121701
ISSUE DATE / TIME: 202001121701
ISSUE DATE / TIME: 202001121701
ISSUE DATE / TIME: 202001121706
ISSUE DATE / TIME: 202001121706
ISSUE DATE / TIME: 202001121706
ISSUE DATE / TIME: 202001121706
UNIT TYPE AND RH: 6200
UNIT TYPE AND RH: 6200
Unit Type and Rh: 6200
Unit Type and Rh: 6200
Unit Type and Rh: 6200
Unit Type and Rh: 6200
Unit Type and Rh: 6200
Unit Type and Rh: 6200
Unit Type and Rh: 6200
Unit Type and Rh: 6200
Unit Type and Rh: 6200
Unit Type and Rh: 6200
Unit Type and Rh: 6200
Unit Type and Rh: 6200
Unit Type and Rh: 6200
Unit Type and Rh: 6200
Unit Type and Rh: 6200
Unit Type and Rh: 6200
Unit Type and Rh: 6200
Unit Type and Rh: 6200
Unit Type and Rh: 6200
Unit Type and Rh: 6200
Unit Type and Rh: 6200
Unit Type and Rh: 6200
Unit Type and Rh: 6200
Unit Type and Rh: 6200
Unit Type and Rh: 6200
Unit Type and Rh: 6200
Unit Type and Rh: 6200
Unit Type and Rh: 6200
Unit Type and Rh: 6200
Unit Type and Rh: 6200

## 2018-01-24 LAB — TYPE AND SCREEN
ABO/RH(D): A POS
Antibody Screen: NEGATIVE
UNIT DIVISION: 0
UNIT DIVISION: 0
UNIT DIVISION: 0
UNIT DIVISION: 0
UNIT DIVISION: 0
UNIT DIVISION: 0
Unit division: 0
Unit division: 0
Unit division: 0
Unit division: 0
Unit division: 0
Unit division: 0
Unit division: 0
Unit division: 0
Unit division: 0
Unit division: 0
Unit division: 0
Unit division: 0
Unit division: 0
Unit division: 0
Unit division: 0
Unit division: 0
Unit division: 0
Unit division: 0
Unit division: 0
Unit division: 0
Unit division: 0
Unit division: 0
Unit division: 0
Unit division: 0
Unit division: 0
Unit division: 0

## 2018-01-24 LAB — BLOOD GAS, ARTERIAL
Acid-base deficit: 0.2 mmol/L (ref 0.0–2.0)
Bicarbonate: 24.3 mmol/L (ref 20.0–28.0)
DRAWN BY: 36496
FIO2: 100
MECHVT: 360 mL
O2 Saturation: 84.3 %
PEEP: 16 cmH2O
Patient temperature: 90.3
RATE: 30 resp/min
pCO2 arterial: 34.1 mmHg (ref 32.0–48.0)
pH, Arterial: 7.441 (ref 7.350–7.450)
pO2, Arterial: 32.5 mmHg — CL (ref 83.0–108.0)

## 2018-01-24 LAB — BPAM FFP
BLOOD PRODUCT EXPIRATION DATE: 202001172359
BLOOD PRODUCT EXPIRATION DATE: 202001172359
BLOOD PRODUCT EXPIRATION DATE: 202001172359
Blood Product Expiration Date: 202001162359
Blood Product Expiration Date: 202001162359
Blood Product Expiration Date: 202001172359
Blood Product Expiration Date: 202001172359
Blood Product Expiration Date: 202001172359
Blood Product Expiration Date: 202001172359
Blood Product Expiration Date: 202001172359
Blood Product Expiration Date: 202001172359
Blood Product Expiration Date: 202001172359
Blood Product Expiration Date: 202001172359
Blood Product Expiration Date: 202001172359
Blood Product Expiration Date: 202001172359
Blood Product Expiration Date: 202001172359
Blood Product Expiration Date: 202001172359
Blood Product Expiration Date: 202001172359
Blood Product Expiration Date: 202001172359
Blood Product Expiration Date: 202001172359
Blood Product Expiration Date: 202001172359
Blood Product Expiration Date: 202001172359
ISSUE DATE / TIME: 202001120104
ISSUE DATE / TIME: 202001120104
ISSUE DATE / TIME: 202001121145
ISSUE DATE / TIME: 202001121145
ISSUE DATE / TIME: 202001121439
ISSUE DATE / TIME: 202001121439
ISSUE DATE / TIME: 202001121439
ISSUE DATE / TIME: 202001121439
ISSUE DATE / TIME: 202001121637
ISSUE DATE / TIME: 202001121637
ISSUE DATE / TIME: 202001121637
ISSUE DATE / TIME: 202001121637
ISSUE DATE / TIME: 202001121709
ISSUE DATE / TIME: 202001121709
ISSUE DATE / TIME: 202001121715
ISSUE DATE / TIME: 202001121715
ISSUE DATE / TIME: 202001130755
ISSUE DATE / TIME: 202001130755
ISSUE DATE / TIME: 202001130755
ISSUE DATE / TIME: 202001130755
ISSUE DATE / TIME: 202001130755
ISSUE DATE / TIME: 202001130755
UNIT TYPE AND RH: 6200
UNIT TYPE AND RH: 6200
UNIT TYPE AND RH: 6200
Unit Type and Rh: 6200
Unit Type and Rh: 6200
Unit Type and Rh: 6200
Unit Type and Rh: 6200
Unit Type and Rh: 600
Unit Type and Rh: 600
Unit Type and Rh: 600
Unit Type and Rh: 600
Unit Type and Rh: 6200
Unit Type and Rh: 6200
Unit Type and Rh: 6200
Unit Type and Rh: 6200
Unit Type and Rh: 6200
Unit Type and Rh: 6200
Unit Type and Rh: 6200
Unit Type and Rh: 6200
Unit Type and Rh: 6200
Unit Type and Rh: 6200
Unit Type and Rh: 6200

## 2018-01-24 LAB — POCT I-STAT 7, (LYTES, BLD GAS, ICA,H+H)
ACID-BASE DEFICIT: 21 mmol/L — AB (ref 0.0–2.0)
Acid-base deficit: 18 mmol/L — ABNORMAL HIGH (ref 0.0–2.0)
Acid-base deficit: 23 mmol/L — ABNORMAL HIGH (ref 0.0–2.0)
Bicarbonate: 12.1 mmol/L — ABNORMAL LOW (ref 20.0–28.0)
Bicarbonate: 7.6 mmol/L — ABNORMAL LOW (ref 20.0–28.0)
Bicarbonate: 12.4 mmol/L — ABNORMAL LOW (ref 20.0–28.0)
CALCIUM ION: 0.82 mmol/L — AB (ref 1.15–1.40)
Calcium, Ion: 0.66 mmol/L — CL (ref 1.15–1.40)
Calcium, Ion: 0.35 mmol/L — CL (ref 1.15–1.40)
HCT: 15 % — ABNORMAL LOW (ref 36.0–46.0)
HCT: 35 % — ABNORMAL LOW (ref 36.0–46.0)
HEMATOCRIT: 21 % — AB (ref 36.0–46.0)
Hemoglobin: 5.1 g/dL — CL (ref 12.0–15.0)
Hemoglobin: 7.1 g/dL — ABNORMAL LOW (ref 12.0–15.0)
Hemoglobin: 11.9 g/dL — ABNORMAL LOW (ref 12.0–15.0)
O2 Saturation: 100 %
O2 Saturation: 86 %
O2 Saturation: 54 %
PCO2 ART: 50.7 mmHg — AB (ref 32.0–48.0)
PO2 ART: 79 mmHg — AB (ref 83.0–108.0)
PO2 ART: 53 mmHg — AB (ref 83.0–108.0)
Potassium: 3.4 mmol/L — ABNORMAL LOW (ref 3.5–5.1)
Potassium: 3.9 mmol/L (ref 3.5–5.1)
Potassium: 4.8 mmol/L (ref 3.5–5.1)
SODIUM: 154 mmol/L — AB (ref 135–145)
Sodium: 155 mmol/L — ABNORMAL HIGH (ref 135–145)
Sodium: 152 mmol/L — ABNORMAL HIGH (ref 135–145)
TCO2: 14 mmol/L — ABNORMAL LOW (ref 22–32)
TCO2: 8 mmol/L — ABNORMAL LOW (ref 22–32)
TCO2: 15 mmol/L — ABNORMAL LOW (ref 22–32)
pCO2 arterial: 28 mmHg — ABNORMAL LOW (ref 32.0–48.0)
pCO2 arterial: 78.5 mmHg (ref 32.0–48.0)
pH, Arterial: 6.985 — CL (ref 7.350–7.450)
pH, Arterial: 7.044 — CL (ref 7.350–7.450)
pH, Arterial: 6.807 — CL (ref 7.350–7.450)
pO2, Arterial: 538 mmHg — ABNORMAL HIGH (ref 83.0–108.0)

## 2018-01-24 LAB — MAGNESIUM: Magnesium: 1.8 mg/dL (ref 1.7–2.4)

## 2018-01-24 LAB — BPAM CRYOPRECIPITATE
Blood Product Expiration Date: 202001122300
ISSUE DATE / TIME: 202001121736
Unit Type and Rh: 6200

## 2018-01-24 LAB — CBC
HCT: 56.4 % — ABNORMAL HIGH (ref 36.0–46.0)
HEMATOCRIT: 56.7 % — AB (ref 36.0–46.0)
HEMOGLOBIN: 19.5 g/dL — AB (ref 12.0–15.0)
Hemoglobin: 19.8 g/dL — ABNORMAL HIGH (ref 12.0–15.0)
MCH: 29.1 pg (ref 26.0–34.0)
MCH: 29.3 pg (ref 26.0–34.0)
MCHC: 34.6 g/dL (ref 30.0–36.0)
MCHC: 34.9 g/dL (ref 30.0–36.0)
MCV: 84.2 fL (ref 80.0–100.0)
MCV: 84 fL (ref 80.0–100.0)
Platelets: 18 10*3/uL — CL (ref 150–400)
Platelets: 19 10*3/uL — CL (ref 150–400)
RBC: 6.7 MIL/uL — ABNORMAL HIGH (ref 3.87–5.11)
RBC: 6.75 MIL/uL — ABNORMAL HIGH (ref 3.87–5.11)
RDW: 15.1 % (ref 11.5–15.5)
RDW: 15.2 % (ref 11.5–15.5)
WBC: 13.7 10*3/uL — ABNORMAL HIGH (ref 4.0–10.5)
WBC: 15.2 10*3/uL — ABNORMAL HIGH (ref 4.0–10.5)
nRBC: 0.8 % — ABNORMAL HIGH (ref 0.0–0.2)
nRBC: 0.8 % — ABNORMAL HIGH (ref 0.0–0.2)

## 2018-01-24 LAB — BPAM PLATELET PHERESIS
Blood Product Expiration Date: 202001122359
ISSUE DATE / TIME: 202001121605
Unit Type and Rh: 7300

## 2018-01-24 LAB — APTT: aPTT: 40 seconds — ABNORMAL HIGH (ref 24–36)

## 2018-01-24 LAB — HEPATIC FUNCTION PANEL
ALT: 194 U/L — ABNORMAL HIGH (ref 0–44)
AST: 1023 U/L — AB (ref 15–41)
Albumin: 2.4 g/dL — ABNORMAL LOW (ref 3.5–5.0)
Alkaline Phosphatase: 62 U/L (ref 38–126)
Bilirubin, Direct: 0.6 mg/dL — ABNORMAL HIGH (ref 0.0–0.2)
Indirect Bilirubin: 1 mg/dL — ABNORMAL HIGH (ref 0.3–0.9)
Total Bilirubin: 1.6 mg/dL — ABNORMAL HIGH (ref 0.3–1.2)
Total Protein: 4.5 g/dL — ABNORMAL LOW (ref 6.5–8.1)

## 2018-01-24 LAB — PREPARE CRYOPRECIPITATE: UNIT DIVISION: 0

## 2018-01-24 LAB — CULTURE, BLOOD (ROUTINE X 2)
CULTURE: NO GROWTH
Culture: NO GROWTH

## 2018-01-24 LAB — RENAL FUNCTION PANEL
Albumin: 2.4 g/dL — ABNORMAL LOW (ref 3.5–5.0)
Anion gap: 18 — ABNORMAL HIGH (ref 5–15)
BUN: 19 mg/dL (ref 6–20)
CO2: 20 mmol/L — AB (ref 22–32)
Calcium: 5.9 mg/dL — CL (ref 8.9–10.3)
Chloride: 104 mmol/L (ref 98–111)
Creatinine, Ser: 1.22 mg/dL — ABNORMAL HIGH (ref 0.44–1.00)
GFR calc Af Amer: >60 mL/min (ref >60)
GFR calc non Af Amer: 53 mL/min — ABNORMAL LOW (ref >60)
Glucose, Bld: 117 mg/dL — ABNORMAL HIGH (ref 70–99)
Phosphorus: 3.5 mg/dL (ref 2.5–4.6)
Potassium: 4 mmol/L (ref 3.5–5.1)
Sodium: 142 mmol/L (ref 135–145)

## 2018-01-24 LAB — PREPARE PLATELET PHERESIS: Unit division: 0

## 2018-01-24 LAB — GLUCOSE, CAPILLARY
Glucose-Capillary: 109 mg/dL — ABNORMAL HIGH (ref 70–99)
Glucose-Capillary: 109 mg/dL — ABNORMAL HIGH (ref 70–99)

## 2018-01-24 LAB — LACTIC ACID, PLASMA: Lactic Acid, Venous: 8.8 mmol/L (ref 0.5–1.9)

## 2018-01-24 LAB — VANCOMYCIN, RANDOM: VANCOMYCIN RM: 8

## 2018-01-24 LAB — PROCALCITONIN: Procalcitonin: 12.24 ng/mL

## 2018-01-24 LAB — HAPTOGLOBIN: Haptoglobin: 49 mg/dL (ref 42–296)

## 2018-01-24 LAB — MASSIVE TRANSFUSION PROTOCOL ORDER (BLOOD BANK NOTIFICATION)

## 2018-01-24 MED ORDER — GLYCOPYRROLATE 0.2 MG/ML IJ SOLN: 0.2000 mg | INTRAMUSCULAR | Status: DC | PRN

## 2018-01-24 MED ORDER — GLYCOPYRROLATE 1 MG PO TABS: 1.0000 mg | ORAL_TABLET | ORAL | Status: DC | PRN

## 2018-01-24 MED ORDER — EPINEPHRINE PF 1 MG/ML IJ SOLN
0.5000 ug/min | INTRAVENOUS | Status: DC
  Filled 2018-01-24: qty 4

## 2018-01-24 MED ORDER — PRISMASOL BGK 4/2.5 32-4-2.5 MEQ/L REPLACEMENT SOLN
Status: DC
  Filled 2018-01-24: qty 5000

## 2018-01-24 MED ORDER — DIPHENHYDRAMINE HCL 50 MG/ML IJ SOLN: 25.0000 mg | INTRAMUSCULAR | Status: DC | PRN

## 2018-01-24 MED ORDER — VANCOMYCIN HCL 500 MG IV SOLR
500.0000 mg | INTRAVENOUS | Status: DC
  Filled 2018-01-24: qty 500

## 2018-01-24 MED ORDER — DEXTROSE 5 % IV SOLN
INTRAVENOUS | Status: DC
  Administered 2018-01-24: 06:00:00 via INTRAVENOUS

## 2018-01-24 MED ORDER — POLYVINYL ALCOHOL 1.4 % OP SOLN: 1.0000 [drp] | Freq: Four times a day (QID) | OPHTHALMIC | Status: DC | PRN

## 2018-01-24 MED FILL — Medication: Qty: 1 | Status: AC

## 2018-01-26 ENCOUNTER — Telehealth: Payer: Self-pay | Admitting: Pulmonary Disease

## 2018-01-26 NOTE — Telephone Encounter (Signed)
01/26/2018 Received D/C from [facility] sent by courier to [facility] at pulmonary office. PWR.

## 2018-01-27 NOTE — Telephone Encounter (Signed)
01/27/2018 received D/C back from Dr. Elsworth Soho I faxed copy to [phone] [facility] home will mail the original to Dr. Bari Mantis office.PWR

## 2018-02-10 NOTE — Telephone Encounter (Incomplete)
02/10/18 I received original D/c from [facility]. Sent to Dr. Elsworth Soho at Pulmonary to be signed. PWR

## 2018-02-12 NOTE — Progress Notes (Signed)
PCCM Progress Note  Patient progressively hypotensive/hypoxic throughout the night despite CRRT, Multiple Pressors, and  Vent Settings of 100 FiO2 and 16 PEEP. Family gathered to bedside. Decision to transition to comfort care made. Orders placed in EMR.   Hayden Pedro, AGACNP-BC [facility]  PCCM Pgr: [phone]

## 2018-02-12 NOTE — Progress Notes (Signed)
Pharmacy Antibiotic Note  Vanc level 8; will continue with standard CRRT vanc dosing.  Plan: Vancomycin 500mg  IV Q24H; goal trough 15-20.  Temp (24hrs), Avg:93.7 F (34.3 C), Min:90 F (32.2 C), Max:97.2 F (36.2 C)  Recent Labs  Lab 01/13/2018 2204 01/20/18 0329  01/21/2018 0042 02/02/2018 0245 02/07/2018 0859 01/26/2018 0900  01/15/2018 1407 01/20/2018 1854 01/31/2018 2155 Feb 03, 2018 0157 2018/02/03 0349  WBC  --  7.9   < > 14.4* 17.8*  --  30.3*   < > 15.1* 3.6* 10.2 13.7* 15.2*  CREATININE  --  4.78*   < > 3.24* 2.97*  --  2.98*  --  2.67* 2.13*  --   --   --   LATICACIDVEN 0.75 0.4*  --   --  4.6* 11.0*  --   --   --   --   --   --  8.8*  VANCORANDOM  --   --   --   --   --   --   --   --   --   --   --   --  8   < > = values in this interval not displayed.      Thank you for allowing pharmacy to be a part of this patient's care.  Wynona Neat, PharmD, BCPS  2018/02/03 4:52 AM

## 2018-02-12 NOTE — Death Summary Note (Addendum)
NAME:  Katherine Kramer, MRN:  [id number], DOB:  [birth year], LOS: 5 ADMISSION DATE:  01/26/2018, CONSULTATION DATE:  02/03/2018 REFERRING MD:  Dr. Jenean Lindau CHIEF COMPLAINT:  BRBPR, Hypovolemic shock   Brief History   Katherine Kramer is a 48 year old woman with cervical cancer status post chemoradiation & Hartmann procedure, radiation proctitis, bilateral nephrosis status post nephrostomy tubes and CKD stage IV who initially presented on 1/8 with lethargy and altered mental status thought to be secondary to opioid intoxication.  She was also found to be anemic with positive FOBT.  Her hospital course has been complicated with hypotension with systolic blood pressure in the 50s, anemia with Hbg of 6. She received 3U PRBC and 2U FFP with repeat Hgb of 7.1  PCCM was consulted on 01/23/2017 and she was subsequently started on phenylephrine and intubated due to inability to protect her airway.  Past Medical History  Cervical cancer (Dx April 2014) s/p -Pelvic lymphadenopathy and oophoropexy, May 2014  -Chemoradiation (EBRT and HDR brachytherapy w/ weekly cisplatin) complicated with radiation proctitis -Bilateral ureteral obstruction status post bilateral nephrostomy tubes -Sigmoid perforation status post Hartman's procedure April 2017 -Colostomy dilatation for ostomy, September 2017 -Colostomy revision, January 2018 -Hartman's closure with diverting loop ileostomy, June 2018 -ileostomy takedown 11/05/2017 Radiation proctitis  Bilateral nephrosis s/p nephrostomy tubes  CKD IV  Significant Hospital Events   1/8> Admission  1/12>PCCM consult  1/12 >Intubated  1/12>PEA arrest x2 1/12> IMA arteriogram, RLE arteriogram,Covered stent repair of ruptured right common and external iliac arteries  Consults:  General Surgery Interventional Radiology   Procedures:  1/12>Intubation  1/12>IMA arteriogram, RLE arteriogram,Covered stent repair of ruptured right common and external iliac  arteries 1/12>CRRT  Significant Diagnostic Tests:  CT Arteriogram   Micro Data:  1/8 Blood Cx> NGTD  Antimicrobials:  1/12>Meropenem  1/12>Flagyl  1/12 Vancomycin > 1/12  Interim history/subjective:  Overnight: Hypotensive, hypoxic, Family made decision to pursue comfort care   Today, Katherine Kramer was examined with family and Chaplain at bedside  Objective   Blood pressure (!) 59/29, pulse (!) 103, temperature (!) 90.3 F (32.4 C), temperature source Oral, resp. rate (!) 30, height 5\' 2"  (1.575 m), weight 46.6 kg, SpO2 (!) 88 %. CVP:  [13 mmHg-20 mmHg] 17 mmHg  Vent Mode: PRVC FiO2 (%):  [40 %-100 %] 100 % Set Rate:  [24 bmp-30 bmp] 30 bmp Vt Set:  [300 mL-460 mL] 360 mL PEEP:  [5 cmH20-16 cmH20] 16 cmH20 Plateau Pressure:  [13 cmH20-39 cmH20] 32 cmH20   Intake/Output Summary (Last 24 hours) at 02-07-18 0626 Last data filed at 02-07-18 0500 Gross per 24 hour  Intake 14104.46 ml  Output 3922 ml  Net 10182.46 ml   Filed Weights   01/20/18 0801 01/21/18 0609  Weight: 52 kg 46.6 kg    Examination: General: Intubated, unresponsive HENT: Cyanosis, ETT in place, NCAT Lungs: CTABL, no wheezes or crackles Cardiovascular: RRR, no MGR Abdomen: Distended, NTTP, old midline scar Extremities: Edematous, cyanotic  Neuro: unresponsive  GU: Foley catheter in place   Resolved Hospital Problem list     Assessment & Plan:  Katherine Kramer is a 48 year old woman with cervical cancer status post chemoradiation & Hartmann procedure, radiation proctitis, bilateral nephrosis status post nephrostomy tubes and CKD stage IV transferred admitted to the ICU with shock. Her hospital course has been complicated with PEA arrest from massive blood loss requiring an enormous amount of volume resuscitation with PRBC (21U), FFP(10U), platelet(1U) and Cryoprecipitate (1U). She subsequently underwent inferior mesenteric  arteriogram, RLE arteriogram and covered stent repair of ruptured right common  and external iliac arteries. She was placed on CRRT due to persistent hypoxia from hypervolemia despite bring on multiple Pressors, and  Vent Settings of 100 FiO2 and 16 PEEP. The family subsequently made the decision to withdraw care and pursue comfort care.  She was found to be in asystole at 0633, absent heart and breath sounds, pupils fixed and dilate. She was pronounced dead at [phone].  Active hospital problems:  #Hemorrhagic Shock 2/2 ruptured right common iliac artery s/p stent repair #Acute blood loss anemia  #Acute toxic encephalopathy  #Hypocalcemia-resolved  #Hypokalemia resolved  #Demand Ischemia  #CKD Stage IV #Non anion gap metabolic acidosis   Best practice:  Diet: NPO Pain/Anxiety/Delirium protocol (if indicated): Yes VAP protocol (if indicated): yes DVT prophylaxis: SCDs GI prophylaxis: Protonix ggt  Glucose control: None Mobility: Bedrest Code Status: Full  Family Communication: None at bedside  Disposition: Remain in ICU   Labs   CBC: Recent Labs  Lab 01/29/2018 2000 01/20/18 0329 01/20/18 1127  01/28/2018 1407 01/14/2018 1854 02/08/2018 2155 05-Feb-2018 0157 02/05/18 0349  WBC 9.9 7.9 9.0   < > 15.1* 3.6* 10.2 13.7* 15.2*  NEUTROABS 7.2 5.6 6.7  --   --   --   --   --   --   HGB 6.3* 6.3* 9.9*   < > 14.5 15.3* 17.6* 19.5* 19.8*  HCT 22.1* 22.1* 33.0*   < > 42.5 47.5* 51.8* 56.4* 56.7*  MCV 98.7 97.8 95.9   < > 86.9 92.2 87.6 84.2 84.0  PLT 847* 623* 774*   < > 36* 34* 25* 18* 19*   < > = values in this interval not displayed.    Basic Metabolic Panel: Recent Labs  Lab 01/29/2018 0245 02/06/2018 0900 02/03/2018 1407 01/29/2018 1854 2018/02/05 0349  NA 145 150* 147* 152* 142  K 3.4* 4.5 2.7* 3.3* 4.0  CL 119* 128* 122* 123* 104  CO2 13* <7* 10* 13* 20*  GLUCOSE 260* 157* 274* 225* 117*  BUN 51* 52* 53* 37* 19  CREATININE 2.97* 2.98* 2.67* 2.13* 1.22*  CALCIUM 6.4* 7.4* 5.8* 6.9* 5.9*  MG  --   --   --   --  1.8  PHOS  --   --   --   --  3.5   GFR: Estimated  Creatinine Clearance: 42.8 mL/min (A) (by C-G formula based on SCr of 1.22 mg/dL (H)). Recent Labs  Lab 01/20/18 0329 01/20/18 0749  02/05/2018 0245 01/30/2018 0859  02/09/2018 1854 01/27/2018 2155 2018/02/05 0157 02/05/18 0349  PROCALCITON  --  1.25  --  0.24  --   --   --   --   --   --   WBC 7.9  --    < > 17.8*  --    < > 3.6* 10.2 13.7* 15.2*  LATICACIDVEN 0.4*  --   --  4.6* 11.0*  --   --   --   --  8.8*   < > = values in this interval not displayed.    Liver Function Tests: Recent Labs  Lab 01/25/2018 2000 01/20/18 0329 02/10/2018 0245 01/26/2018 0900 02-05-18 0349  AST 20 16 18 28  1,023*  ALT 9 6 8 10  194*  ALKPHOS 67 51 29* 37* 62  BILITOT 0.6 0.9 0.4 0.6 1.6*  PROT 7.5 5.8* 3.6* <3.0* 4.5*  ALBUMIN 2.5* 1.9* 1.6* 1.3* 2.4*  2.4*   No results for input(s): LIPASE,  AMYLASE in the last 168 hours. Recent Labs  Lab 02/06/2018 2000  AMMONIA 15    ABG    Component Value Date/Time   PHART 7.441 01-29-18 0335   PCO2ART 34.1 Jan 29, 2018 0335   PO2ART 32.5 (LL) 01-29-2018 0335   HCO3 24.3 01/29/2018 0335   TCO2 32 02/02/2018 2021   ACIDBASEDEF 0.2 2018-01-29 0335   O2SAT 84.3 January 29, 2018 0335     Coagulation Profile: Recent Labs  Lab 02/03/2018 0042 01/14/2018 1407 02/06/2018 1854  INR 1.35 1.67 1.98    Cardiac Enzymes: Recent Labs  Lab 01/20/18 0749 02/10/2018 0245 02/05/2018 0900 02/06/2018 1407  CKTOTAL 118  --   --   --   TROPONINI <0.03 0.05* 0.30* 0.28*    HbA1C: No results found for: HGBA1C  CBG: Recent Labs  Lab 02/02/2018 0240 01/29/2018 0402 01/18/2018 0739 01/15/2018 2358 01-29-18 0349  GLUCAP 248* 203* 217* 109* 109*

## 2018-02-12 NOTE — Progress Notes (Signed)
Spoke with medical examiner at 06:59, patient not and ME case.

## 2018-02-12 NOTE — Progress Notes (Signed)
I responded to a page from the nurse to provide spiritual support for the patient's family. I visited the patient's room with her sister, brother, and their spouses present. I provide spiritual support by sharing words of comfort, by reading Scriptures, and leading in prayer. I remained with the family until the patient expired. I shared that the Chaplain is available to provide additional support as needed or requested.    02/11/18 0647  Clinical Encounter Type  Visited With Patient and family together  Visit Type Spiritual support;Death  Referral From Nurse  Consult/Referral To Chaplain  Spiritual Encounters  Spiritual Needs Prayer;Grief support    Chaplain Dr Redgie Grayer

## 2018-02-12 NOTE — Progress Notes (Signed)
Family at bedside sister Joelene Millin and brother Shanon Brow..Decision made to withdrawal of care with comfort care  Fentanyl drip restarted for comfort at 75 mcg/hr for comfort Pt extubated to 4 L/min . Family at bedside with Chaplin present. Pt asystole at [phone]. No heart sounds and no breath sounds noted . Pupils fixed and dilated.Confirmed by Jabier Gauss RN Not an ME case and family does not want a post. CDS notified.[facility] in [place].

## 2018-02-12 NOTE — Procedures (Signed)
Extubation Procedure Note Terminal extubation following MD order.   Patient Details:   Name: Katherine Kramer DOB: 11-24-1970 MRN: [id number]   Airway Documentation:    Vent end date: Feb 18, 2018 Vent end time: 0618   Evaluation  O2 sats: terminal extubation      [name] Feb 18, 2018, 6:18 AM

## 2018-02-12 NOTE — Progress Notes (Signed)
eLink Physician-Brief Progress Note Patient Name: Katherine Kramer DOB: Jul 31, 1970 MRN: [id number]   Date of Service  16-Feb-2018  HPI/Events of Note  Multiple issues: 1. Hypotension - BP = 57/50 and 2. ABG on 100%/PRVC 30/TV 360/P 8 = 7.44/34.1/32.5/44. NaHCO3 being removed from CRRT replacement and NaHCO3 IV infusion being D/Ced.   eICU Interventions  Will order: 1. Epinephrine IV infusion. Titrate to MAP >= 65.  2. Ground team requested to evaluate at bedside.      Intervention Category Major Interventions: Respiratory failure - evaluation and management;Hypotension - evaluation and management  Sommer,Steven [name] 02-16-2018, 4:22 AM

## 2018-02-12 NOTE — Progress Notes (Signed)
[facility] Progress Note Patient Name: Katherine Kramer DOB: 1970/08/21 MRN: [id number]   Date of Service  02/18/2018  HPI/Events of Note  Hypotension - BP = 79/18 with MAP = 37 on Norepinephrine, Phenylephrine and Vasopressin doses at ceiling. ABG pending.   eICU Interventions  Will order: 1. Increase ceiling on Phenylephrine IV infusion to 70 mcg/min.  2. Await ABG.      Intervention Category Major Interventions: Hypotension - evaluation and management  Sommer,Steven Eugene 02-18-2018, 3:58 AM

## 2018-02-12 DEATH — deceased

## 2018-02-15 NOTE — Telephone Encounter (Signed)
Received Signed D/C, faxed copy to [facility] home along with calling [facility] as requested.

## 2020-04-08 IMAGING — XA IR EXCHANGE NEPHROSTOMY RIGHT
1 series · 3 of 3 positions shown · non-contrast
Comparison: None.

INDICATION: 47-year-old female with a history of cervical cancer and bilateral
ureteral obstruction. She has bilateral percutaneous nephrostomy
tubes which were placed at another institution. They have not been
changed since Tuesday September, 2017. She is currently admitted as an
inpatient and presents for nephrostomy tube check and exchange.

EXAM:
Nephrostomy tube exchange, right
Nephrostomy tube exchange, left

[Series 300: dsa body · 3 of 3 slices shown]
[im 1/3]
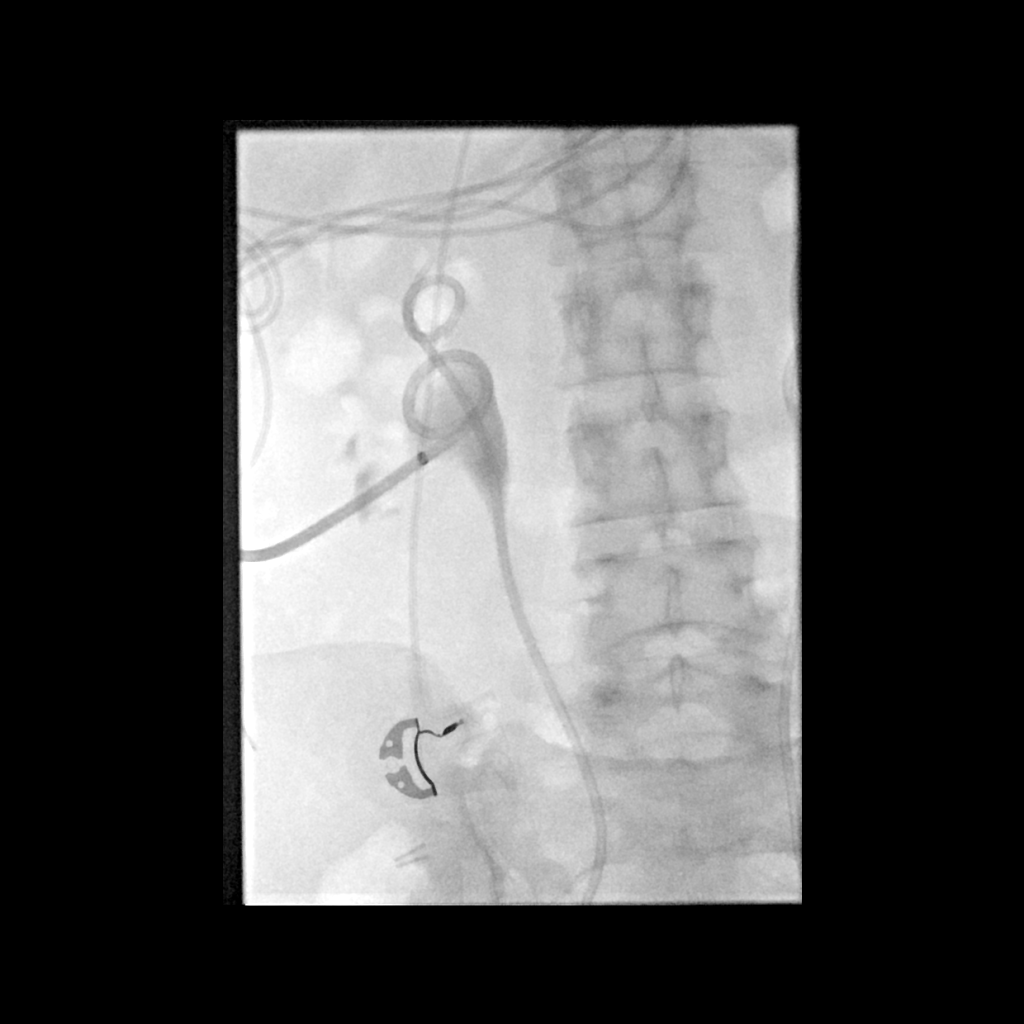
[im 2/3]
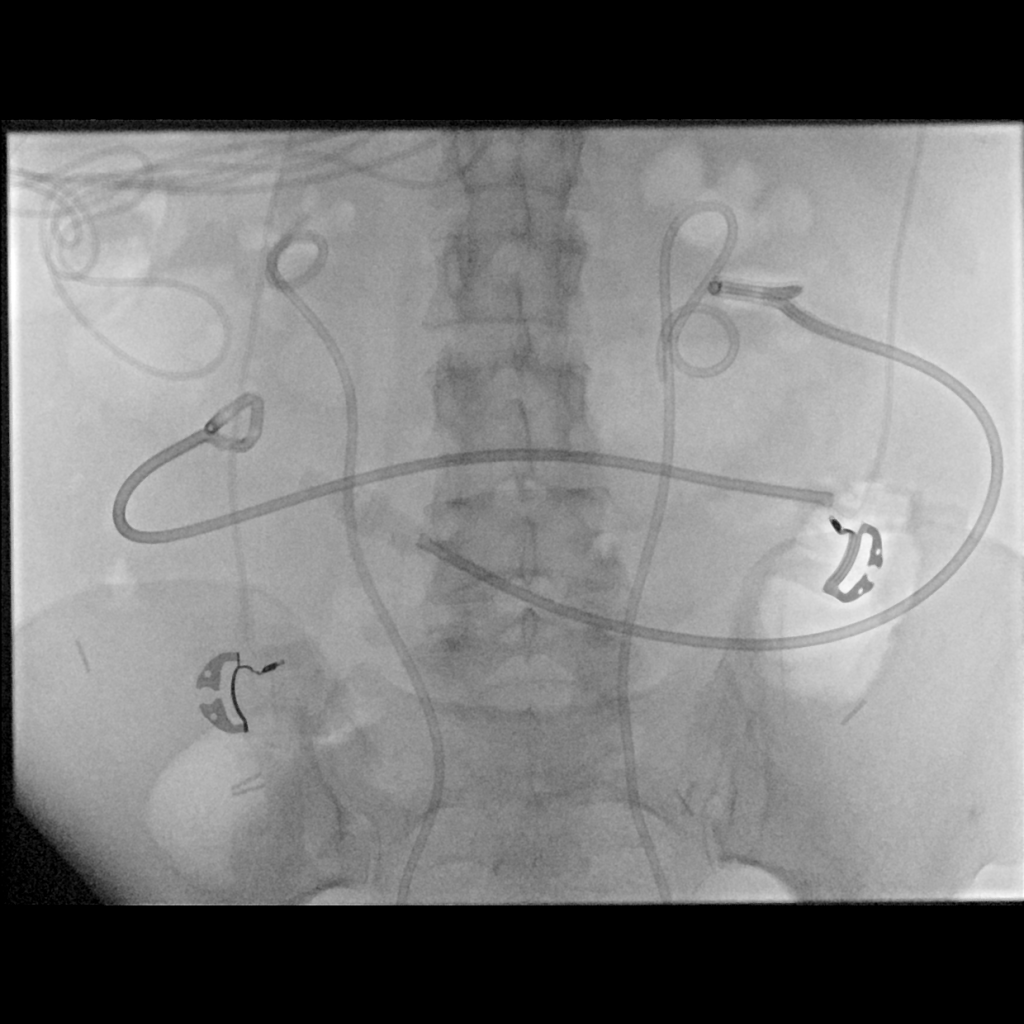
[im 3/3]
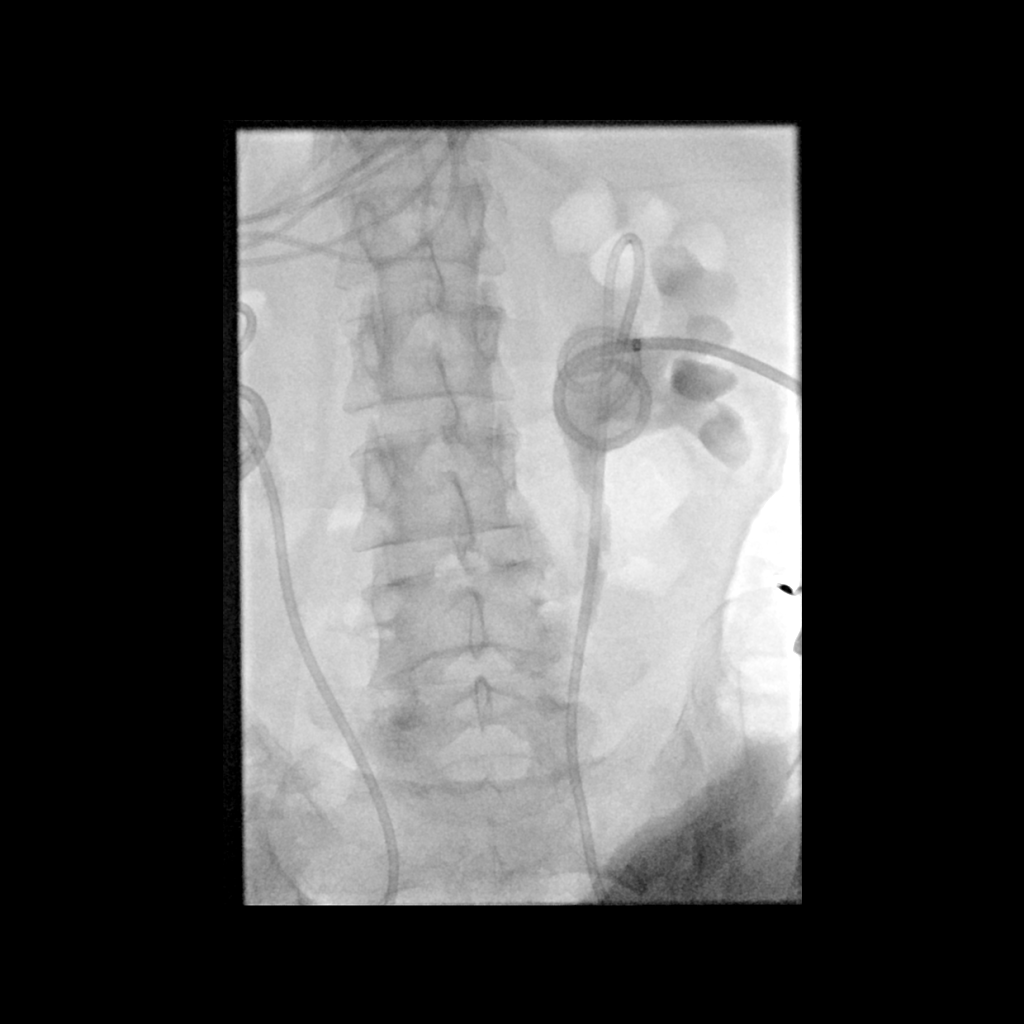

[3 of 3 positions shown; findings below may reference images not displayed]

MEDICATIONS:
None

ANESTHESIA/SEDATION:
None

CONTRAST:  10 mL Isovue 370-administered into the collecting
system(s)

FLUOROSCOPY TIME:  Fluoroscopy Time: 1 minutes 6 seconds (1.8 mGy).

COMPLICATIONS:
None immediate.

PROCEDURE:
Informed written consent was obtained from the patient after a
thorough discussion of the procedural risks, benefits and
alternatives. All questions were addressed. Maximal Sterile Barrier
Technique was utilized including caps, mask, sterile gowns, sterile
gloves, sterile drape, hand hygiene and skin antiseptic. A timeout
was performed prior to the initiation of the procedure.

Attention was first turned to the left percutaneous nephrostomy
tube. A gentle hand injection of contrast material was performed.
The tube is located within a lower pole calyx, just within the
collecting system. The tube was transected and a Bentson wire
successfully navigated through the tube and back into the renal
pelvis. The tube was removed over the wire. A new 10 Ione Poon
Hohn drainage catheter was advanced over the wire and formed
in the renal pelvis. An image was obtained and stored for the
medical record. The tube was connected to gravity bag drainage.

Attention was next turned to the right percutaneous nephrostomy
tube. A gentle hand injection of contrast material was performed.
The tube is present within the upper pole infundibulum. The tube was
transected and removed over a Bentson wire. Akmyrat Gs 10 French
percutaneous nephrostomy tube was advanced over the wire into the
renal pelvis where it was successfully formed. An image was obtained
and stored for the medical record.

Overall, the patient tolerated the procedure well.

Of note, there are bilateral double-J ureteral stents which appear
to be occluded.
IMPRESSION: Successful exchange of bilateral percutaneous nephrostomy tubes.

The bilateral internal double-J ureteral stents appear to be
occluded.

PLAN:
[REDACTED] in 8 weeks for scheduled
bilateral nephrostomy tube exchange.

## 2020-04-10 IMAGING — DX DG ABD PORTABLE 1V
1 series · 1 of 1 positions shown · non-contrast
Comparison: 01/21/2018

CLINICAL DATA: NG tube placement

EXAM:
PORTABLE ABDOMEN - 1 VIEW

[abdomen kub]
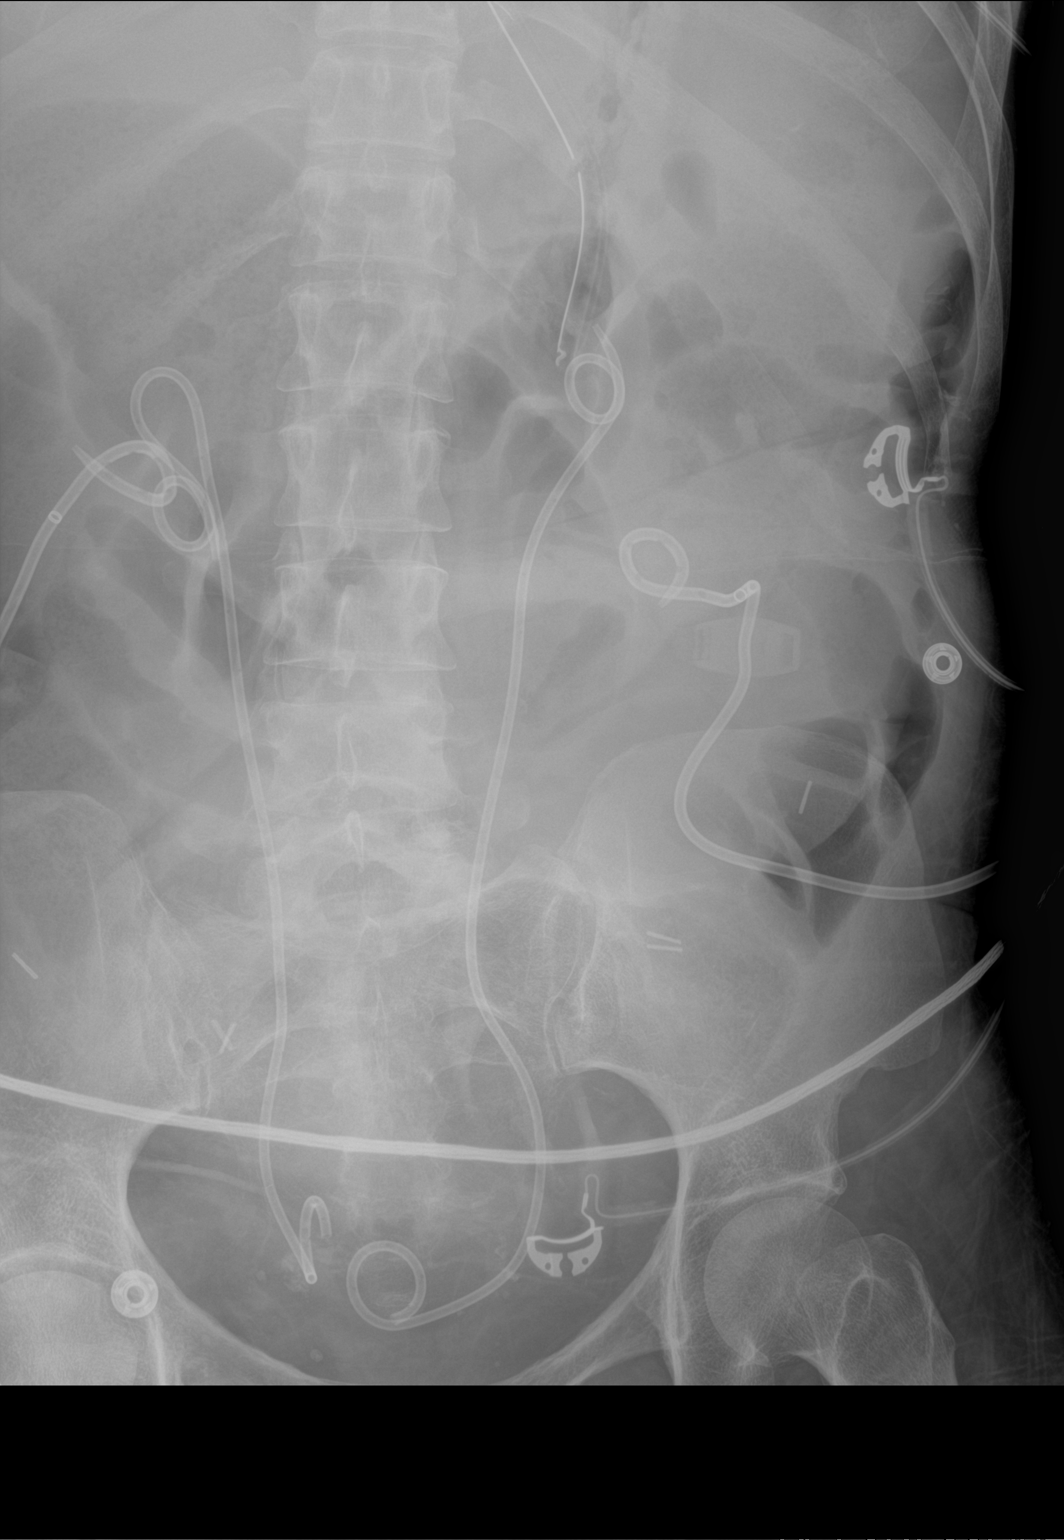

[1 of 1 positions shown; findings below may reference images not displayed]

FINDINGS: Enteric tube tip is in the left upper quadrant consistent with
location in the body of the stomach. Bilateral nephrostomy tubes and
ureteral stents are demonstrated. Gas-filled small and large bowel
without significant distention, likely ileus.
IMPRESSION: Enteric tube tip is in the left upper quadrant consistent with
location in the body of the stomach.
